# Patient Record
Sex: Male | Born: 1950 | Race: White | Hispanic: No | Marital: Married | Smoking: Never smoker
Health system: Southern US, Community
[De-identification: ages and names within clinical notes are randomized; demographics above are authoritative.]

## PROBLEM LIST (undated history)

## (undated) DIAGNOSIS — T7840XA Allergy, unspecified, initial encounter: Secondary | ICD-10-CM

## (undated) DIAGNOSIS — M4800 Spinal stenosis, site unspecified: Secondary | ICD-10-CM

## (undated) DIAGNOSIS — M1990 Unspecified osteoarthritis, unspecified site: Secondary | ICD-10-CM

## (undated) DIAGNOSIS — C801 Malignant (primary) neoplasm, unspecified: Secondary | ICD-10-CM

## (undated) DIAGNOSIS — I10 Essential (primary) hypertension: Secondary | ICD-10-CM

## (undated) DIAGNOSIS — J342 Deviated nasal septum: Secondary | ICD-10-CM

## (undated) DIAGNOSIS — E785 Hyperlipidemia, unspecified: Secondary | ICD-10-CM

## (undated) DIAGNOSIS — K219 Gastro-esophageal reflux disease without esophagitis: Secondary | ICD-10-CM

## (undated) HISTORY — PX: UPPER GASTROINTESTINAL ENDOSCOPY: SHX188

## (undated) HISTORY — DX: Unspecified osteoarthritis, unspecified site: M19.90

## (undated) HISTORY — PX: PROSTATECTOMY: SHX69

## (undated) HISTORY — DX: Spinal stenosis, site unspecified: M48.00

## (undated) HISTORY — DX: Gastro-esophageal reflux disease without esophagitis: K21.9

## (undated) HISTORY — DX: Malignant (primary) neoplasm, unspecified: C80.1

## (undated) HISTORY — PX: SPINE SURGERY: SHX786

## (undated) HISTORY — DX: Allergy, unspecified, initial encounter: T78.40XA

## (undated) HISTORY — PX: NASAL SEPTUM SURGERY: SHX37

---

## 2000-10-23 ENCOUNTER — Encounter: Admission: RE | Admit: 2000-10-23 | Discharge: 2001-01-21 | Payer: Self-pay | Admitting: *Deleted

## 2007-05-26 ENCOUNTER — Emergency Department (HOSPITAL_COMMUNITY): Admission: EM | Admit: 2007-05-26 | Discharge: 2007-05-27 | Payer: Self-pay | Admitting: Emergency Medicine

## 2008-06-29 ENCOUNTER — Ambulatory Visit: Payer: Self-pay | Admitting: Family Medicine

## 2008-06-29 DIAGNOSIS — M25569 Pain in unspecified knee: Secondary | ICD-10-CM | POA: Insufficient documentation

## 2008-06-29 DIAGNOSIS — E785 Hyperlipidemia, unspecified: Secondary | ICD-10-CM | POA: Insufficient documentation

## 2008-06-29 DIAGNOSIS — J309 Allergic rhinitis, unspecified: Secondary | ICD-10-CM | POA: Insufficient documentation

## 2008-06-29 DIAGNOSIS — I10 Essential (primary) hypertension: Secondary | ICD-10-CM | POA: Insufficient documentation

## 2008-06-29 DIAGNOSIS — M109 Gout, unspecified: Secondary | ICD-10-CM | POA: Insufficient documentation

## 2008-06-29 DIAGNOSIS — B019 Varicella without complication: Secondary | ICD-10-CM | POA: Insufficient documentation

## 2008-07-22 ENCOUNTER — Ambulatory Visit: Payer: Self-pay | Admitting: Family Medicine

## 2008-07-22 DIAGNOSIS — T7840XA Allergy, unspecified, initial encounter: Secondary | ICD-10-CM | POA: Insufficient documentation

## 2009-09-10 IMAGING — CR DG CHEST 2V
2 series · 2 of 2 positions shown · non-contrast
Comparison: None.

CLINICAL DATA: Feels like meat stuck in esophagus.  Difficulty swallowing. 
 CHEST - 2 VIEW:

[w chest pa]
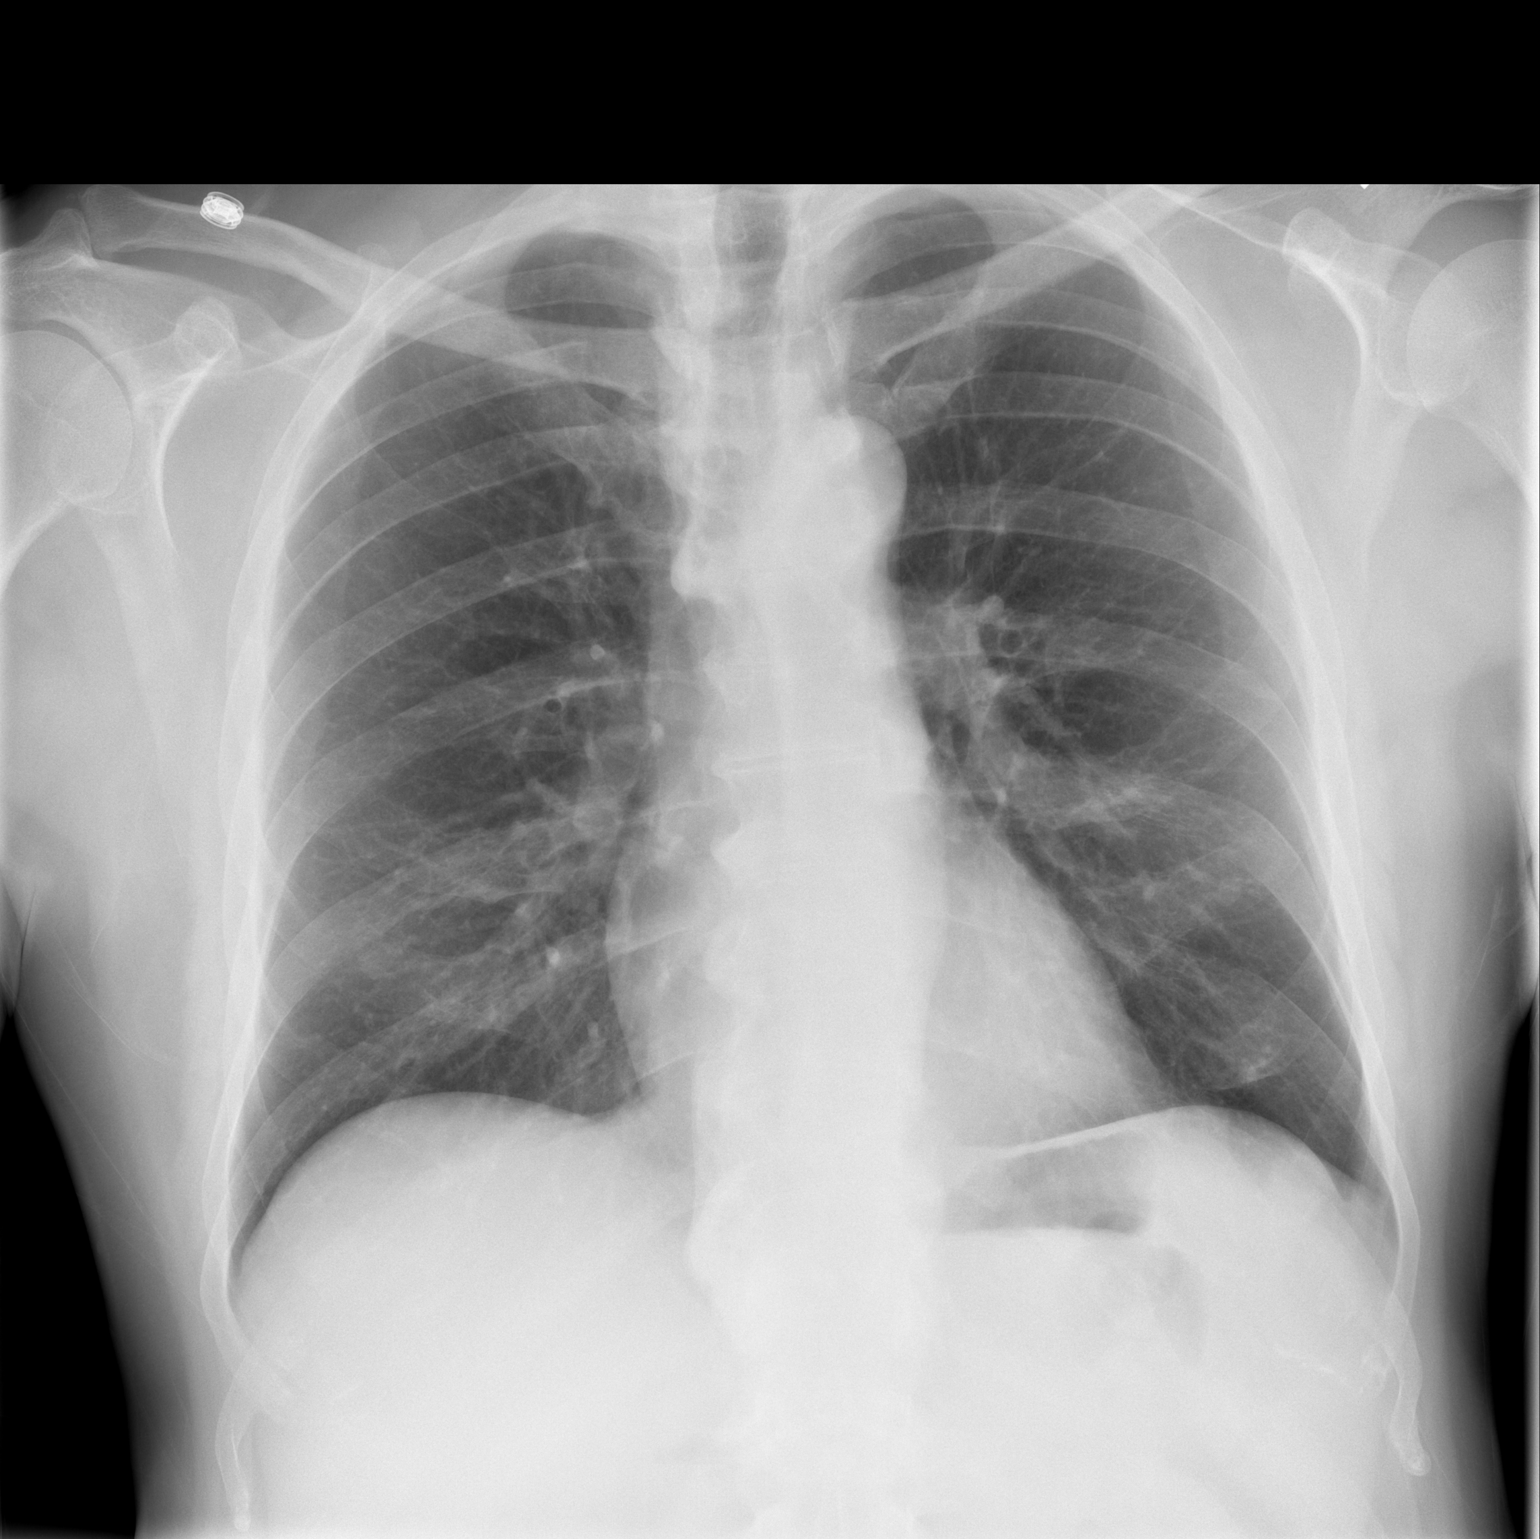

[w chest lat]
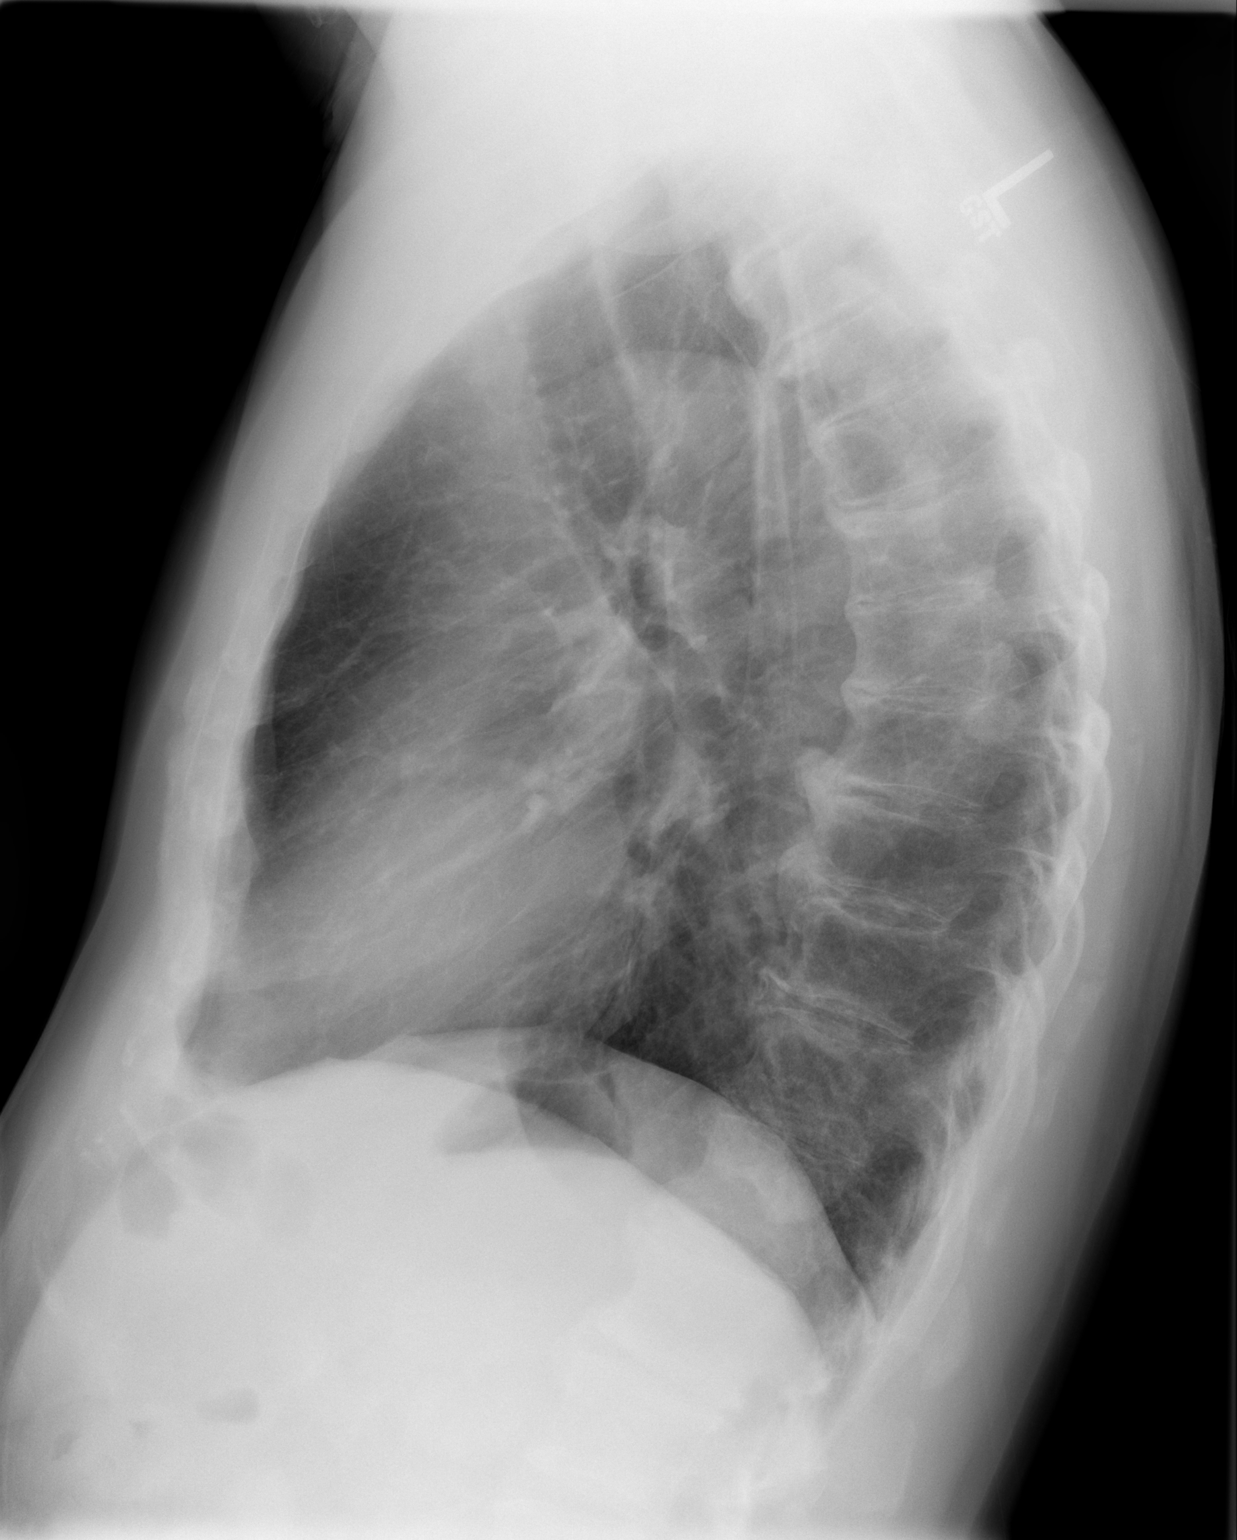

[2 of 2 positions shown; findings below may reference images not displayed]

FINDINGS: The heart size and mediastinal contours are unremarkable.  
 No pleural fluid or pulmonary edema is noted. 
 No airspace opacities are identified. 
 There is mild multilevel thoracic spondylosis.
IMPRESSION: No active disease.

## 2010-07-26 NOTE — Consult Note (Signed)
NAMEONOFRIO, Joseph NO.:  [id number]   MEDICAL RECORD NO.:  [id number]          PATIENT TYPE:  EMS   LOCATION:  MAJO                         FACILITY:  [facility]   PHYSICIAN:  Bernette Redbird, M.D.   DATE OF BIRTH:  Jan 30, 1951   DATE OF CONSULTATION:  05/27/2007  DATE OF DISCHARGE:                                 CONSULTATION   Dr. Benjiman Core of the [facility]. 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   Joseph Stanton has a history of intermittent dysphagia and in fact, his  primary physician had arranged for him to have GI evaluation and what  sounds like endoscopic dilatation through the [facility] where she  works in [place], [place].  He actually apparently had the  appointment with that clinic but deferred having his endoscopic  procedure because of the considerable expense that would be incurred  since he went on a new insurance plan recently.   With that background, he ate steak last night and it got caught in his  esophagus.  He presented to our emergency room and, per Dr. Arlington Calix  report, a chest CT showed what looked like a food bolus distally as well  as some additional material above it.  We were called and asked to  provide food disimpaction.  Since the patient was not in acute distress  and since it was approximately 3:30 in the morning, it was elected to  wait until 7 o'clock this morning to do the procedure.  In the meantime,  the patient had been given IV glucagon and a trial of sublingual  nitroglycerin x3 without immediate effect, but subsequently, the patient  fell sleep and subsequently the food bolus subjectively passed.   By the time we evaluated the patient at 7:00 in the morning, he was  having no secretions and still felt as though the bolus had passed.  We  gave him to cups of warm water which she drank without  any regurgitation  or difficulty, although he did have some burping during and following  the consumption of the fluid.   The patient has a handicapped son at home and wanted to get on home  rather than undergoing a procedure at this time.   He was informed that his CT scan looked abnormal and that, although his  symptoms are probably due to a benign condition such as an esophageal  ring, GI evaluation was needed to make sure that esophageal cancer was  not present.   He intends to be on a soft/liquid diet during the remainder of the week  and to follow up with his primary physician toward the end of the week  to have his esophagus taken care of.   Accordingly, we will not plan to arrange follow-up with [place].  This is an  unassigned patient through the emergency room.   No charge for this visit.           ______________________________  Bernette Redbird, M.D.  RB/MEDQ  D:  05/27/2007  T:  05/27/2007  Job:  [id number]   cc:   O. Danae Orleans, M.D.

## 2010-12-05 LAB — BASIC METABOLIC PANEL
CO2: 28
Calcium: 9.1
Chloride: 97
GFR calc Af Amer: >60
Potassium: 4.8
Sodium: 135

## 2010-12-05 LAB — DIFFERENTIAL
Lymphocytes Relative: 14
Lymphs Abs: 1.5
Neutro Abs: 8.2 — ABNORMAL HIGH
Neutrophils Relative %: 77

## 2010-12-05 LAB — CBC
Platelets: 245
WBC: 10.6 — ABNORMAL HIGH

## 2010-12-05 LAB — I-STAT 8, (EC8 V) (CONVERTED LAB)
BUN: 13
Glucose, Bld: 115 — ABNORMAL HIGH
Hemoglobin: 17
Potassium: 6.6
Sodium: 135
TCO2: 31

## 2010-12-05 LAB — POCT I-STAT CREATININE: Creatinine, Ser: 1.1

## 2011-04-01 ENCOUNTER — Encounter (HOSPITAL_COMMUNITY): Payer: Self-pay | Admitting: *Deleted

## 2011-04-01 ENCOUNTER — Emergency Department (HOSPITAL_COMMUNITY)
Admission: EM | Admit: 2011-04-01 | Discharge: 2011-04-02 | Disposition: A | Discharge status: Home / Self Care | Payer: PRIVATE HEALTH INSURANCE | Attending: Emergency Medicine | Admitting: Emergency Medicine

## 2011-04-01 DIAGNOSIS — R040 Epistaxis: Secondary | ICD-10-CM | POA: Insufficient documentation

## 2011-04-01 DIAGNOSIS — I10 Essential (primary) hypertension: Secondary | ICD-10-CM | POA: Insufficient documentation

## 2011-04-01 DIAGNOSIS — E785 Hyperlipidemia, unspecified: Secondary | ICD-10-CM | POA: Insufficient documentation

## 2011-04-01 HISTORY — DX: Essential (primary) hypertension: I10

## 2011-04-01 HISTORY — DX: Hyperlipidemia, unspecified: E78.5

## 2011-04-01 HISTORY — DX: Deviated nasal septum: J34.2

## 2011-04-01 NOTE — ED Notes (Signed)
ZOX:[id number]<AV> Expected date:<BR> Expected time:<BR> Means of arrival:<BR> Comments:<BR> Nose bleed

## 2011-04-01 NOTE — ED Notes (Signed)
Per EMS: Pt called EMS for a c/o of uncontrolled nose bleed. Bleeding is controlled on arrival. Pt is A&O x4, PERRLA. Hx of htn. Pt reports multiple bleeds today. Pt denies HA, N/V, blurred vision. Pt is in no apparent distress

## 2011-04-02 MED ORDER — OXYMETAZOLINE HCL 0.05 % NA SOLN
1.0000 | Freq: Once | NASAL | Status: AC
  Administered 2011-04-02: 1 via NASAL
  Filled 2011-04-02: qty 15

## 2011-04-02 MED ORDER — SILVER NITRATE-POT NITRATE 75-25 % EX MISC
1.0000 | Freq: Once | CUTANEOUS | Status: AC
  Administered 2011-04-02: 1 via TOPICAL
  Filled 2011-04-02: qty 1

## 2011-04-02 NOTE — ED Provider Notes (Signed)
Medical screening examination/treatment/procedure(s) were performed by non-physician practitioner and as supervising physician I was immediately available for consultation/collaboration.  Nicholes Stairs, MD 04/02/11 [phone]

## 2011-04-02 NOTE — ED Provider Notes (Signed)
History     CSN: [id number]  Arrival date & time 04/01/11  2308   First MD Initiated Contact with Patient 04/02/11 0018      Chief Complaint  Patient presents with  . Epistaxis    (Consider location/radiation/quality/duration/timing/severity/associated sxs/prior treatment) HPI Comments: Patient here with nose bleed - states lasted about 10 minutes - reports recent history of multiple bleeds per day x 1 week - saw PCP this past week about these - blood pressure noted to be elevated and was placed on additional medication - reports mainly from right nare but occasionally from left - no anticoagulant use noted.  Patient is a 61 y.o. male presenting with nosebleeds. The history is provided by the patient. No language interpreter was used.  Epistaxis  This is a recurrent problem. The current episode started more than 1 week ago. The problem occurs every several days. The problem has not changed since onset.The problem is associated with an unknown factor. The bleeding has been from the right nare. He has tried applying pressure for the symptoms. The treatment provided moderate relief. His past medical history is significant for frequent nosebleeds. His past medical history does not include bleeding disorder, sinus problems or allergies.    Past Medical History  Diagnosis Date  . Hypertension   . Gout   . Hyperlipidemia   . Deviated nasal septum     Past Surgical History  Procedure Date  . Nasal septum surgery     History reviewed. No pertinent family history.  History  Substance Use Topics  . Smoking status: Never Smoker   . Smokeless tobacco: Not on file  . Alcohol Use: No      Review of Systems  HENT: Positive for nosebleeds.   All other systems reviewed and are negative.    Allergies  Review of patient's allergies indicates no known allergies.  Home Medications   Current Outpatient Rx  Name Route Sig Dispense Refill  . ALLOPURINOL 100 MG PO TABS Oral Take 100  mg by mouth daily.    Marland Kitchen DILTIAZEM HCL ER COATED BEADS 360 MG PO CP24 Oral Take 360 mg by mouth daily.    Marland Kitchen DOXYCYCLINE HYCLATE 100 MG PO TABS Oral Take 100 mg by mouth daily. Patient is to take for 7 days started on 03-23-2011    . LOVASTATIN 40 MG PO TABS Oral Take 40 mg by mouth daily.      BP 179/111  Pulse 103  Temp(Src) 98.3 F (36.8 C) (Oral)  Resp 20  SpO2 97%  Physical Exam  Nursing note and vitals reviewed. Constitutional: He is oriented to person, place, and time. He appears well-developed and well-nourished. No distress.  HENT:  Head: Normocephalic and atraumatic.  Right Ear: External ear normal.  Left Ear: External ear normal.  Nose: Mucosal edema present. No septal deviation or nasal septal hematoma. Epistaxis is observed.  Mouth/Throat: Oropharynx is clear and moist. No oropharyngeal exudate.       Excoriation noted to right nare at septum, no septal defect noted.  Eyes: Conjunctivae are normal. Pupils are equal, round, and reactive to light. No scleral icterus.  Neck: Normal range of motion. Neck supple.  Cardiovascular: Normal rate, regular rhythm and normal heart sounds.  Exam reveals no gallop and no friction rub.   No murmur heard. Pulmonary/Chest: Effort normal and breath sounds normal. He exhibits no tenderness.  Abdominal: Soft. Bowel sounds are normal. He exhibits distension.  Musculoskeletal: Normal range of motion.  Lymphadenopathy:  He has no cervical adenopathy.  Neurological: He is alert and oriented to person, place, and time. No cranial nerve deficit.  Skin: Skin is warm and dry.  Psychiatric: He has a normal mood and affect. His behavior is normal. Judgment and thought content normal.    ED Course  EPISTAXIS MANAGEMENT Date/Time: 04/02/2011 1:42 AM Performed by: Marisue Humble, Navika Hoopes C. Authorized by: Patrecia Pour Consent: Verbal consent obtained. Written consent not obtained. Risks and benefits: risks, benefits and alternatives were  discussed Consent given by: patient Patient understanding: patient states understanding of the procedure being performed Patient consent: the patient's understanding of the procedure does not match consent given Procedure consent: procedure consent does not match procedure scheduled Relevant documents: relevant documents not present or verified Test results: test results not available Site marked: the operative site was not marked Imaging studies: imaging studies not available Patient identity confirmed: verbally with patient and arm band Time out: Immediately prior to procedure a "time out" was called to verify the correct patient, procedure, equipment, support staff and site/side marked as required. Local anesthetic: lidocaine spray Anesthetic total: 3 ml Patient sedated: no Treatment site: right anterior Repair method: silver nitrate Post-procedure assessment: bleeding stopped Treatment complexity: simple Patient tolerance: Patient tolerated the procedure well with no immediate complications.   (including critical care time)  Labs Reviewed - No data to display No results found.  Epistaxis right nare    MDM  Patient with acute epistaxis of right nare - history of same, hemodynamically stable - bleeding controlled after silver nitrate.        Izola Price [place], [place] 04/02/11 [phone]

## 2017-10-15 ENCOUNTER — Other Ambulatory Visit: Payer: Self-pay

## 2017-10-15 ENCOUNTER — Encounter (HOSPITAL_COMMUNITY): Payer: Self-pay

## 2017-10-15 ENCOUNTER — Emergency Department (HOSPITAL_COMMUNITY): Payer: Medicare Other

## 2017-10-15 ENCOUNTER — Ambulatory Visit (HOSPITAL_COMMUNITY)
Admission: EM | Admit: 2017-10-15 | Discharge: 2017-10-16 | Disposition: A | Discharge status: Home / Self Care | Payer: Medicare Other | Attending: Emergency Medicine | Admitting: Emergency Medicine

## 2017-10-15 DIAGNOSIS — Z79899 Other long term (current) drug therapy: Secondary | ICD-10-CM | POA: Diagnosis not present

## 2017-10-15 DIAGNOSIS — T18128A Food in esophagus causing other injury, initial encounter: Secondary | ICD-10-CM

## 2017-10-15 DIAGNOSIS — X58XXXA Exposure to other specified factors, initial encounter: Secondary | ICD-10-CM | POA: Diagnosis not present

## 2017-10-15 DIAGNOSIS — K222 Esophageal obstruction: Secondary | ICD-10-CM | POA: Diagnosis not present

## 2017-10-15 DIAGNOSIS — M109 Gout, unspecified: Secondary | ICD-10-CM | POA: Diagnosis not present

## 2017-10-15 DIAGNOSIS — E785 Hyperlipidemia, unspecified: Secondary | ICD-10-CM | POA: Insufficient documentation

## 2017-10-15 DIAGNOSIS — K228 Other specified diseases of esophagus: Secondary | ICD-10-CM | POA: Diagnosis not present

## 2017-10-15 DIAGNOSIS — I10 Essential (primary) hypertension: Secondary | ICD-10-CM | POA: Insufficient documentation

## 2017-10-15 MED ORDER — GI COCKTAIL ~~LOC~~
30.0000 mL | Freq: Once | ORAL | Status: AC
  Administered 2017-10-16: 30 mL via ORAL
  Filled 2017-10-15: qty 30

## 2017-10-15 MED ORDER — ONDANSETRON 4 MG PO TBDP
8.0000 mg | ORAL_TABLET | Freq: Once | ORAL | Status: AC
  Administered 2017-10-15: 8 mg via ORAL
  Filled 2017-10-15: qty 2

## 2017-10-15 NOTE — ED Provider Notes (Signed)
[place] EMERGENCY DEPARTMENT Provider Note   CSN: [id number] Arrival date & time: 10/15/17  1735     History   Chief Complaint Chief Complaint  Patient presents with  . Dysphagia    HPI Joseph Stanton Range is a 67 y.o. male.  The history is provided by the patient.  Illness  This is a new problem. The current episode started yesterday. The problem occurs constantly. The problem has not changed since onset.Pertinent negatives include no chest pain, no abdominal pain, no headaches and no shortness of breath. Nothing aggravates the symptoms. Nothing relieves the symptoms. He has tried nothing for the symptoms. The treatment provided no relief.  Patient with PMH including cervical fusion presents with reports of occasional solids getting stuck for some time.  Most recently, chicken got stuck yesterday.  Then was able to tolerate PO, an english muffin without regurgitation and then juice starting coming back up this evening.  No SOB.  No weakness nor numbness.  No changes in vision or speech.    Past Medical History:  Diagnosis Date  . Deviated nasal septum   . Gout   . Hyperlipidemia   . Hypertension     Patient Active Problem List   Diagnosis Date Noted  . ALLERGY, ENVIRONMENTAL 07/22/2008  . CHICKENPOX 06/29/2008  . HYPERLIPIDEMIA 06/29/2008  . GOUT 06/29/2008  . HYPERTENSION 06/29/2008  . ALLERGIC RHINITIS 06/29/2008  . KNEE PAIN 06/29/2008    Past Surgical History:  Procedure Laterality Date  . NASAL SEPTUM SURGERY          Home Medications    Prior to Admission medications   Medication Sig Start Date End Date Taking? Authorizing Provider  allopurinol (ZYLOPRIM) 100 MG tablet Take 100 mg by mouth daily.    [provider]  diltiazem (CARDIZEM CD) 360 MG 24 hr capsule Take 360 mg by mouth daily.    [provider]  doxycycline (VIBRA-TABS) 100 MG tablet Take 100 mg by mouth daily. Patient is to take for 7 days started on  03-23-2011    [provider]  lovastatin (MEVACOR) 40 MG tablet Take 40 mg by mouth daily.    [provider]    Family History History reviewed. No pertinent family history.  Social History Social History   Tobacco Use  . Smoking status: Never Smoker  Substance Use Topics  . Alcohol use: No  . Drug use: No     Allergies   Patient has no known allergies.   Review of Systems Review of Systems  Constitutional: Negative for appetite change, diaphoresis and unexpected weight change.  HENT:       Food getting stuck  Eyes: Negative for visual disturbance.  Respiratory: Negative for shortness of breath.   Cardiovascular: Negative for chest pain, palpitations and leg swelling.  Gastrointestinal: Negative for abdominal pain.  Neurological: Negative for dizziness, seizures, syncope, facial asymmetry, speech difficulty, weakness, numbness and headaches.  All other systems reviewed and are negative.    Physical Exam Updated Vital Signs BP (!) 184/88 (BP Location: Right Arm)   Pulse 87   Temp 98.2 F (36.8 C) (Oral)   Resp 14   Ht 5\' 9"  (1.753 m)   Wt 90.3 kg (199 lb)   SpO2 99%   BMI 29.39 kg/m   Physical Exam  Constitutional: He is oriented to person, place, and time. He appears well-developed and well-nourished. No distress.  HENT:  Head: Normocephalic and atraumatic.  Nose: Nose normal.  Mouth/Throat:  No oropharyngeal exudate.  Eyes: Pupils are equal, round, and reactive to light. Conjunctivae are normal.  Neck: Normal range of motion. Neck supple.  Cardiovascular: Normal rate, regular rhythm, normal heart sounds and intact distal pulses.  Pulmonary/Chest: Effort normal and breath sounds normal. No stridor. No respiratory distress. He has no wheezes. He has no rales. He exhibits no tenderness.  Abdominal: Soft. Bowel sounds are normal. He exhibits no mass. There is no tenderness. There is no rebound and no guarding.  Musculoskeletal: Normal range  of motion.  Neurological: He is alert and oriented to person, place, and time. He displays normal reflexes.  Skin: Skin is warm and dry. Capillary refill takes less than 2 seconds.  Psychiatric: He has a normal mood and affect.  Nursing note and vitals reviewed.    ED Treatments / Results  Labs (all labs ordered are listed, but only abnormal results are displayed) Labs Reviewed - No data to display  EKG None  Radiology No results found.  Procedures Procedures (including critical care time)  Medications Ordered in ED Medications  ondansetron (ZOFRAN-ODT) disintegrating tablet 8 mg (has no administration in time range)  gi cocktail (Maalox,Lidocaine,Donnatal) (has no administration in time range)    Seen by Gi for esophageal impaction.     Final Clinical Impressions(s) / ED Diagnoses   Follow up with GI as an outpatient for dysphagia as well as your PMD. Eat a soft diet until seen by GI.  RX for Prilosec.     Return for pain, numbness, changes in vision or speech, fevers >100.4 unrelieved by medication, shortness of breath, intractable vomiting, or diarrhea, abdominal pain, Inability to tolerate liquids or food, cough, altered mental status or any concerns. No signs of systemic illness or infection. The patient is nontoxic-appearing on exam and vital signs are within normal limits. Will refer to urology for microscopy hematuria as patient is asymptomatic.  I have reviewed the triage vital signs and the nursing notes. Pertinent labs &imaging results that were available during my care of the patient were reviewed by me and considered in my medical decision making (see chart for details).  After history, exam, and medical workup I feel the patient has been appropriately medically screened and is safe for discharge home. Pertinent diagnoses were discussed with the patient. Patient was given return precautions.     Zoha Spranger, MD 10/16/17 [phone]

## 2017-10-15 NOTE — ED Notes (Signed)
Pt alert and oriented. Airway patent. Speech clear. Secretions controlled. Pt reports no difficulty breathing.

## 2017-10-15 NOTE — ED Triage Notes (Signed)
Pt endorses difficulty swallowing "for a while" Pt states that he drank some orange juice earlier today while trying to take his meds and the orange juice came back. Hypertensive in triage and did not take meds due to difficulty swallowing. Airway intact.

## 2017-10-15 NOTE — ED Notes (Signed)
Patient transported to X-ray 

## 2017-10-16 ENCOUNTER — Encounter (HOSPITAL_COMMUNITY): Payer: Self-pay | Admitting: *Deleted

## 2017-10-16 ENCOUNTER — Encounter (HOSPITAL_COMMUNITY)
Admission: EM | Disposition: A | Discharge status: Home / Self Care | Payer: Self-pay | Source: Home / Self Care | Attending: Emergency Medicine

## 2017-10-16 DIAGNOSIS — K228 Other specified diseases of esophagus: Secondary | ICD-10-CM

## 2017-10-16 DIAGNOSIS — T18128A Food in esophagus causing other injury, initial encounter: Secondary | ICD-10-CM

## 2017-10-16 DIAGNOSIS — K222 Esophageal obstruction: Secondary | ICD-10-CM

## 2017-10-16 HISTORY — PX: ESOPHAGOGASTRODUODENOSCOPY (EGD) WITH PROPOFOL: SHX5813

## 2017-10-16 HISTORY — PX: FOREIGN BODY REMOVAL: SHX962

## 2017-10-16 LAB — BASIC METABOLIC PANEL
Anion gap: 11 (ref 5–15)
BUN: 13 mg/dL (ref 8–23)
CHLORIDE: 103 mmol/L (ref 98–111)
CO2: 29 mmol/L (ref 22–32)
CREATININE: 0.83 mg/dL (ref 0.61–1.24)
Calcium: 9.4 mg/dL (ref 8.9–10.3)
GFR calc Af Amer: >60 mL/min (ref >60)
GFR calc non Af Amer: >60 mL/min (ref >60)
GLUCOSE: 131 mg/dL — AB (ref 70–99)
POTASSIUM: 3.6 mmol/L (ref 3.5–5.1)
SODIUM: 143 mmol/L (ref 135–145)

## 2017-10-16 LAB — CBC WITH DIFFERENTIAL/PLATELET
ABS IMMATURE GRANULOCYTES: 0 10*3/uL (ref 0.0–0.1)
BASOS ABS: 0.1 10*3/uL (ref 0.0–0.1)
Basophils Relative: 0 %
EOS PCT: 3 %
Eosinophils Absolute: 0.3 10*3/uL (ref 0.0–0.7)
HCT: 46.1 % (ref 39.0–52.0)
HEMOGLOBIN: 15.2 g/dL (ref 13.0–17.0)
Immature Granulocytes: 0 %
LYMPHS PCT: 13 %
Lymphs Abs: 1.6 10*3/uL (ref 0.7–4.0)
MCH: 29.2 pg (ref 26.0–34.0)
MCHC: 33 g/dL (ref 30.0–36.0)
MCV: 88.5 fL (ref 78.0–100.0)
MONOS PCT: 6 %
Monocytes Absolute: 0.8 10*3/uL (ref 0.1–1.0)
Neutro Abs: 9.1 10*3/uL — ABNORMAL HIGH (ref 1.7–7.7)
Neutrophils Relative %: 78 %
Platelets: 177 10*3/uL (ref 150–400)
RBC: 5.21 MIL/uL (ref 4.22–5.81)
RDW: 13.8 % (ref 11.5–15.5)
WBC: 11.8 10*3/uL — ABNORMAL HIGH (ref 4.0–10.5)

## 2017-10-16 SURGERY — ESOPHAGOGASTRODUODENOSCOPY (EGD) WITH PROPOFOL
Anesthesia: Monitor Anesthesia Care

## 2017-10-16 MED ORDER — FENTANYL CITRATE (PF) 100 MCG/2ML IJ SOLN
INTRAMUSCULAR | Status: AC
  Filled 2017-10-16: qty 2

## 2017-10-16 MED ORDER — NITROGLYCERIN 0.4 MG SL SUBL
0.4000 mg | SUBLINGUAL_TABLET | SUBLINGUAL | Status: DC | PRN
  Filled 2017-10-16: qty 1

## 2017-10-16 MED ORDER — MIDAZOLAM HCL 10 MG/2ML IJ SOLN
INTRAMUSCULAR | Status: DC | PRN
  Administered 2017-10-16: 2 mg via INTRAVENOUS
  Administered 2017-10-16: 1 mg via INTRAVENOUS
  Administered 2017-10-16: 2 mg via INTRAVENOUS

## 2017-10-16 MED ORDER — BUTAMBEN-TETRACAINE-BENZOCAINE 2-2-14 % EX AERO
INHALATION_SPRAY | CUTANEOUS | Status: DC | PRN
  Administered 2017-10-16: 1 via TOPICAL

## 2017-10-16 MED ORDER — OMEPRAZOLE 20 MG PO CPDR: 20.0000 mg | DELAYED_RELEASE_CAPSULE | Freq: Every day | ORAL | 0 refills | Status: DC

## 2017-10-16 MED ORDER — SODIUM CHLORIDE 0.9 % IV BOLUS
1000.0000 mL | Freq: Once | INTRAVENOUS | Status: AC
  Administered 2017-10-16: 1000 mL via INTRAVENOUS

## 2017-10-16 MED ORDER — MIDAZOLAM HCL 5 MG/ML IJ SOLN
INTRAMUSCULAR | Status: AC
  Filled 2017-10-16: qty 3

## 2017-10-16 MED ORDER — DIPHENHYDRAMINE HCL 50 MG/ML IJ SOLN
INTRAMUSCULAR | Status: AC
  Filled 2017-10-16: qty 1

## 2017-10-16 MED ORDER — FENTANYL CITRATE (PF) 100 MCG/2ML IJ SOLN
INTRAMUSCULAR | Status: DC | PRN
  Administered 2017-10-16: 25 ug via INTRAVENOUS

## 2017-10-16 SURGICAL SUPPLY — 14 items

## 2017-10-16 NOTE — ED Notes (Signed)
Writer provided pt with saltine crackers and ginger ale, pt was not able to keep it down and vomited into the emesis bag.

## 2017-10-16 NOTE — ED Notes (Addendum)
After drinking GI cocktail, pt attempted to drink water and started spitting. Pt noted to be anxious

## 2017-10-16 NOTE — Op Note (Addendum)
[facility] Patient Name: Staton Markey Range Procedure Date : 10/16/2017 MRN: [id number] Attending MD: Estill Cotta. Loletha Carrow , MD Date of Birth: [birth year] CSN: [id number] Age: 67 Admit Type: Outpatient Procedure:                Upper GI endoscopy Indications:              Removal of foreign body in the esophagus (food                            impaction) Providers:                Estill Cotta. Loletha Carrow, MD, Vista Lawman, RN, Elspeth Cho, Technician Referring MD:             [facility] ED physician Randal Buba) Medicines:                Midazolam 5 mg IV, Fentanyl 25 micrograms IV,                            Cetacaine spray Complications:            No immediate complications. Estimated Blood Loss:     Estimated blood loss: none. Procedure:                Pre-Anesthesia Assessment:                           - Prior to the procedure, a History and Physical                            was performed, and patient medications and                            allergies were reviewed. The patient's tolerance of                            previous anesthesia was also reviewed. The risks                            and benefits of the procedure and the sedation                            options and risks were discussed with the patient.                            All questions were answered, and informed consent                            was obtained. Prior Anticoagulants: The patient has                            taken no previous anticoagulant or antiplatelet  agents. ASA Grade Assessment: II - A patient with                            mild systemic disease. After reviewing the risks                            and benefits, the patient was deemed in                            satisfactory condition to undergo the procedure.                           After obtaining informed consent, the endoscope was                            passed under  direct vision. Throughout the                            procedure, the patient's blood pressure, pulse, and                            oxygen saturations were monitored continuously. The                            GIF-H190 ([id number]) Olympus Adult EGD was introduced                            through the mouth, and advanced to the second part                            of duodenum. The upper GI endoscopy was                            accomplished without difficulty. The patient                            tolerated the procedure well. Scope In: Scope Out: Findings:      Food was found at the gastroesophageal junction. Removal of food was       accomplished with a 4-prong grasper and removal of the food along with       the scope. The scope was then re-insterted.      One benign-appearing, intrinsic moderate stenosis was found at the       gastroesophageal junction. This stenosis measured 1.2 cm (inner       diameter). The stenosis was traversed.      Multiple tongues of salmon-colored mucosa were present. The maximum       longitudinal extent of these esophageal mucosal changes was 2-3 cm in       length. Not biopsied. The distal esophageal mucosa was edematous and       somewhat macerated from the impaction.      The stomach was normal.      The cardia and gastric fundus were normal on retroflexion.      The examined duodenum was normal. Impression:               -  Food at the gastroesophageal junction. Removal                            was successful.                           - Benign-appearing esophageal stenosis.                           - Salmon-colored mucosa.                           - Normal stomach.                           - Normal examined duodenum. Moderate Sedation:      Moderate (conscious) sedation was administered by the endoscopy nurse       and supervised by the endoscopist. The following parameters were       monitored: oxygen saturation, heart rate, blood  pressure, and response       to care. Total physician intraservice time was 12 minutes. Recommendation:           - Discharge patient to home.                           - Resume regular diet.                           - Return to my office at appointment to be                            scheduled. Call for an appointment. Repeat EGD for                            biopsies and stricture dilation will then be                            scheduled.                           - Use Prilosec (omeprazole) 20 mg PO daily. Procedure Code(s):        --- Professional ---                           [phone], Esophagogastroduodenoscopy, flexible,                            transoral; with removal of foreign body(s) Diagnosis Code(s):        --- Professional ---                           E33.295J, Food in esophagus causing other injury,                            initial encounter  K22.2, Esophageal obstruction                           K22.8, Other specified diseases of esophagus                           T18.108A, Unspecified foreign body in esophagus                            causing other injury, initial encounter CPT copyright 2017 [employer] Medical [employer]. All rights reserved. The codes documented in this report are preliminary and upon coder review may  be revised to meet current compliance requirements. Henry L. Loletha Carrow, MD 10/16/2017 5:43:01 AM This report has been signed electronically. Number of Addenda: 0

## 2017-10-16 NOTE — ED Notes (Addendum)
Pt alert and oriented x4. Skin warm and dry. Respirations equal and unlabored. Pt states he has money  to take a cab.Pt ambulatory with steady gait.

## 2017-10-16 NOTE — Op Note (Signed)
Procedure:   EGD with removal foreign body from esophagus  Meds:   Cetacaine spray, Versed 5 mg, Fentanyl 25 micrograms IV  Indication:  Esophageal food impaction  Findings:   Esophagus: Meat impacted in distal esophageal stricture.  Relieved with 4-prong grasper and pulled out through mouth with scope. Scope re-inserted.  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   Stomach:  normal   Duodenum:  normal   Impression:  Esophageal food bolus impaction - relieved.  Recommendations: Will need repeat outpatient EGD, timing TBD. Follow up with me in office.  Call for appointment. OTC omeprazole 20 mg once daily.   Dorna Leitz GI Pager [phone]

## 2017-10-16 NOTE — ED Notes (Signed)
GI for EGD at bedside

## 2017-10-16 NOTE — ED Notes (Signed)
Chem 8 results given to Dr. Randal Buba. Stated that she wants a BMP drawn to confirm Chem 8 results

## 2017-11-06 ENCOUNTER — Ambulatory Visit: Payer: Medicare Other | Admitting: Gastroenterology

## 2017-11-06 ENCOUNTER — Encounter: Payer: Self-pay | Admitting: Gastroenterology

## 2017-11-06 VITALS — BP 144/90 | HR 64 | Ht 69.0 in | Wt 220.0 lb

## 2017-11-06 DIAGNOSIS — K222 Esophageal obstruction: Secondary | ICD-10-CM

## 2017-11-06 DIAGNOSIS — T18128S Food in esophagus causing other injury, sequela: Secondary | ICD-10-CM

## 2017-11-06 NOTE — Progress Notes (Signed)
     [facility] Progress Note  Chief Complaint: Esophageal stricture in the recent food impaction  Subjective  History:  Joseph Stanton follows up after his recent emergency department visit for an esophageal food impaction.  He has been well since then, no recurrent dysphagia since he has been much more careful eating.  He reports at least several months of intermittent solid food dysphagia leading up to that impaction.  He does not get heartburn regularly, and is been on omeprazole 20 mg daily since the recent ED visit.  He denies nausea, vomiting, early satiety or weight loss.  ROS: Cardiovascular:  no chest pain Respiratory: no dyspnea Positive for arthralgias The patient's Past Medical, Family and Social History were reviewed and are on file in the EMR. Past Medical History:  Diagnosis Date  . Deviated nasal septum   . Gout   . Hyperlipidemia   . Hypertension    Past Surgical History:  Procedure Laterality Date  . ESOPHAGOGASTRODUODENOSCOPY (EGD) WITH PROPOFOL N/A 10/16/2017   Procedure: ESOPHAGOGASTRODUODENOSCOPY (EGD) WITH PROPOFOL;  Surgeon: Doran Stabler, MD;  Location: [facility];  Service: Gastroenterology;  Laterality: N/A;  . FOREIGN BODY REMOVAL  10/16/2017   Procedure: FOREIGN BODY REMOVAL;  Surgeon: Doran Stabler, MD;  Location: [facility];  Service: Gastroenterology;;  . NASAL SEPTUM SURGERY      Objective:  Med list reviewed  Current Outpatient Medications:  .  allopurinol (ZYLOPRIM) 100 MG tablet, Take 100 mg by mouth daily., Disp: , Rfl:  .  carvedilol (COREG) 25 MG tablet, Take 25 mg by mouth 2 (two) times daily., Disp: , Rfl: 3 .  losartan (COZAAR) 100 MG tablet, Take 100 mg by mouth daily., Disp: , Rfl: 3 .  lovastatin (MEVACOR) 40 MG tablet, Take 40 mg by mouth daily., Disp: , Rfl:  .  omeprazole (PRILOSEC) 20 MG capsule, Take 1 capsule (20 mg total) by mouth daily., Disp: 30 capsule, Rfl: 0   Vital signs in last 24 hrs: Vitals:   11/06/17  1557  BP: (!) 144/90  Pulse: 64    Physical Exam  Well-appearing, antalgic gait  HEENT: sclera anicteric, oral mucosa moist without lesions  Neck: supple, no thyromegaly, JVD or lymphadenopathy  Cardiac: RRR without murmurs, S1S2 heard, no peripheral edema  Pulm: clear to auscultation bilaterally, normal RR and effort noted  Abdomen: soft, no tenderness, with active bowel sounds. No guarding or palpable hepatosplenomegaly    @ASSESSMENTPLANBEGIN @ Assessment: Encounter Diagnoses  Name Primary?  . Esophageal stricture Yes  . Food impaction of esophagus, sequela    His symptoms are much improved lately, seemingly due to greater caution when eating.  He had a significant stricture with a diameter of about 12 mm.  I advised him to have an upper endoscopy for stricture dilation and repeat assessment of the distal esophageal mucosa to rule out Barrett's esophagus.  The mucosa was not well visualized during the food impaction due to the mucosal edema and acute inflammation of the impaction.  He is agreeable to an upper endoscopy, but expressed some concern about scheduling as he is not certain he can get a ride.  We gave him some information about a ride service for higher, and he will also inquire with some friends or family.  He was given our contact information to let us know soon as possible.  Total time 20 minutes, over half spent face-to-face with patient in counseling and coordination of care.   Nelida Meuse III

## 2017-11-06 NOTE — Patient Instructions (Signed)
If you are age 66 or older, your body mass index should be between 23-30. Your Body mass index is 32.49 kg/m. If this is out of the aforementioned range listed, please consider follow up with your Primary Care Provider.  If you are age 74 or younger, your body mass index should be between 19-25. Your Body mass index is 32.49 kg/m. If this is out of the aformentioned range listed, please consider follow up with your Primary Care Provider.   It has been recommended to you by your physician that you have a(n) EGD completed. Per your request, we did not schedule the procedure(s) today. Please contact our office at [phone] should you decide to have the procedure completed.   It was a pleasure to see you today!  Dr. Loletha Carrow

## 2017-11-08 ENCOUNTER — Telehealth: Payer: Self-pay | Admitting: Gastroenterology

## 2017-11-08 MED ORDER — OMEPRAZOLE 20 MG PO CPDR: 20.0000 mg | DELAYED_RELEASE_CAPSULE | Freq: Every day | ORAL | 2 refills | Status: DC

## 2017-11-08 NOTE — Telephone Encounter (Signed)
done

## 2017-11-08 NOTE — Telephone Encounter (Signed)
Refill request for omeprazole 20mg  1 a day. Last seen 11-07-2017

## 2017-11-14 ENCOUNTER — Encounter: Payer: Self-pay | Admitting: Gastroenterology

## 2017-11-30 ENCOUNTER — Encounter: Payer: Medicare Other | Admitting: Gastroenterology

## 2017-12-03 ENCOUNTER — Encounter: Payer: Medicare Other | Admitting: Internal Medicine

## 2017-12-10 ENCOUNTER — Encounter: Payer: Self-pay | Admitting: Gastroenterology

## 2017-12-10 ENCOUNTER — Other Ambulatory Visit: Payer: Self-pay

## 2017-12-10 ENCOUNTER — Ambulatory Visit (AMBULATORY_SURGERY_CENTER): Payer: Self-pay | Admitting: *Deleted

## 2017-12-10 VITALS — Ht 69.0 in | Wt 221.0 lb

## 2017-12-10 DIAGNOSIS — K222 Esophageal obstruction: Secondary | ICD-10-CM

## 2017-12-10 NOTE — Progress Notes (Signed)
No egg or soy allergy known to patient  No issues with past sedation with any surgeries  or procedures, no intubation problems  No diet pills per patient No home 02 use per patient  No blood thinners per patient  Pt denies issues with constipation  No A fib or A flutter  EMMI video offered and declined by the patient. 

## 2017-12-20 ENCOUNTER — Encounter: Payer: Medicare Other | Admitting: Gastroenterology

## 2022-07-13 ENCOUNTER — Ambulatory Visit: Payer: Medicare Other | Admitting: Nurse Practitioner

## 2022-07-21 ENCOUNTER — Encounter: Payer: Self-pay | Admitting: Nurse Practitioner

## 2022-07-21 ENCOUNTER — Ambulatory Visit: Payer: Medicare HMO | Admitting: Nurse Practitioner

## 2022-07-21 VITALS — BP 190/98 | HR 90 | Temp 99.8°F | Resp 16 | Ht 69.0 in | Wt 219.4 lb

## 2022-07-21 DIAGNOSIS — Z8739 Personal history of other diseases of the musculoskeletal system and connective tissue: Secondary | ICD-10-CM | POA: Diagnosis not present

## 2022-07-21 DIAGNOSIS — E669 Obesity, unspecified: Secondary | ICD-10-CM | POA: Diagnosis not present

## 2022-07-21 DIAGNOSIS — Z125 Encounter for screening for malignant neoplasm of prostate: Secondary | ICD-10-CM

## 2022-07-21 DIAGNOSIS — K219 Gastro-esophageal reflux disease without esophagitis: Secondary | ICD-10-CM | POA: Insufficient documentation

## 2022-07-21 DIAGNOSIS — Z6830 Body mass index (BMI) 30.0-30.9, adult: Secondary | ICD-10-CM | POA: Diagnosis not present

## 2022-07-21 DIAGNOSIS — I10 Essential (primary) hypertension: Secondary | ICD-10-CM | POA: Diagnosis not present

## 2022-07-21 DIAGNOSIS — E785 Hyperlipidemia, unspecified: Secondary | ICD-10-CM

## 2022-07-21 LAB — CBC
RBC: 4.85 10*6/uL (ref 4.20–5.80)
WBC: 7.6 10*3/uL (ref 3.8–10.8)

## 2022-07-21 MED ORDER — ALLOPURINOL 100 MG PO TABS
100.0000 mg | ORAL_TABLET | Freq: Two times a day (BID) | ORAL | 0 refills | Status: DC
Start: 2022-07-21 — End: 2022-08-13

## 2022-07-21 MED ORDER — VALSARTAN 160 MG PO TABS
160.0000 mg | ORAL_TABLET | Freq: Every day | ORAL | 0 refills | Status: DC
Start: 2022-07-21 — End: 2022-08-13

## 2022-07-21 NOTE — Assessment & Plan Note (Signed)
Patient on lovastatin 40mg . Continue

## 2022-07-21 NOTE — Progress Notes (Signed)
New Patient Office Visit  Subjective    Patient ID: Joseph Stanton, male    DOB: Sep 19, 1950  Age: 72 y.o. MRN: [id number]  CC:  Chief Complaint  Patient presents with   Establish Care    HPI Joseph Stanton presents to establish care  HTN: States that he does check his blood pressure at home. He does take all blood pressure medications as prescribed and tolerates them well per his report. States that he always had high numbers at other office even with medication   HLD: patient is currently maintained on lovastatin 40mg  and tolerates medication well per his report  GERD: states that he takes it every other day and does fine. States at least 6 years of being on the medication   Gout: States that he does not have a flare since being on the gout medication. Take allopurinol 100mg  BID   Colonoscopy: states that it has been with [facility] 2-3 years ago.  PSA: prostatectmy hx of prostate cancer in his later 19s   Takes the tylenol twice a day becasuse of his right thumb he injuried 6 weeks ago that has not gotten better.   Outpatient Encounter Medications as of 07/21/2022  Medication Sig   carvedilol (COREG) 25 MG tablet Take 25 mg by mouth 2 (two) times daily.   cloNIDine (CATAPRES) 0.2 MG tablet Take 0.2 mg by mouth 2 (two) times daily.   ketoconazole (NIZORAL) 2 % shampoo APPLY TO AFFECTED AREA TWICE A DAY   lovastatin (MEVACOR) 40 MG tablet Take 40 mg by mouth daily.   Multiple Vitamin (ESSENTIAL ONE DAILY PO) Take 1 capsule by mouth daily.   Multiple Vitamins-Minerals (HAIR/SKIN/NAILS/BIOTIN PO) Take 1 capsule by mouth daily.   omeprazole (PRILOSEC) 20 MG capsule Take 1 capsule (20 mg total) by mouth daily.   valsartan (DIOVAN) 160 MG tablet Take 1 tablet (160 mg total) by mouth daily.   [DISCONTINUED] allopurinol (ZYLOPRIM) 100 MG tablet Take 100 mg by mouth daily.   [DISCONTINUED] losartan (COZAAR) 100 MG tablet Take 100 mg by mouth daily.   allopurinol (ZYLOPRIM) 100 MG tablet  Take 1 tablet (100 mg total) by mouth 2 (two) times daily.   No facility-administered encounter medications on file as of 07/21/2022.    Past Medical History:  Diagnosis Date   Allergy    Arthritis    Cancer (HCC)    prostate   Deviated nasal septum    GERD (gastroesophageal reflux disease)    Gout    Hyperlipidemia    Hypertension    Spinal stenosis     Past Surgical History:  Procedure Laterality Date   ESOPHAGOGASTRODUODENOSCOPY (EGD) WITH PROPOFOL N/A 10/16/2017   Procedure: ESOPHAGOGASTRODUODENOSCOPY (EGD) WITH PROPOFOL;  Surgeon: Sherrilyn Rist, MD;  Location: [facility]COPY;  Service: Gastroenterology;  Laterality: N/A;   FOREIGN BODY REMOVAL  10/16/2017   Procedure: FOREIGN BODY REMOVAL;  Surgeon: Sherrilyn Rist, MD;  Location: [facility];  Service: Gastroenterology;;   NASAL SEPTUM SURGERY     PROSTATECTOMY     SPINE SURGERY     UPPER GASTROINTESTINAL ENDOSCOPY      Family History  Problem Relation Age of Onset   Hypertension Mother    Hypertension Father    Colon cancer Neg Hx    Colon polyps Neg Hx    Esophageal cancer Neg Hx    Rectal cancer Neg Hx    Stomach cancer Neg Hx     Social History   Socioeconomic History  Marital status: Married    Spouse name: Not on file   Number of children: 2   Years of education: Not on file   Highest education level: Not on file  Occupational History   Not on file  Tobacco Use   Smoking status: Never   Smokeless tobacco: Never  Vaping Use   Vaping Use: Never used  Substance and Sexual Activity   Alcohol use: No   Drug use: No   Sexual activity: Not on file  Other Topics Concern   Not on file  Social History Narrative   Not on file   Social Determinants of Health   Financial Resource Strain: Not on file  Food Insecurity: Not on file  Transportation Needs: Not on file  Physical Activity: Not on file  Stress: Not on file  Social Connections: Not on file  Intimate Partner Violence: Not on file     Review of Systems  Constitutional:  Negative for chills and fever.  Respiratory:  Negative for shortness of breath.   Cardiovascular:  Negative for chest pain.  Gastrointestinal:  Negative for abdominal pain, blood in stool, constipation, diarrhea, nausea and vomiting.       BM daily   Genitourinary:  Negative for dysuria and hematuria.       Nocturia 4-5 times a night   Neurological:  Negative for headaches.  Psychiatric/Behavioral:  Negative for hallucinations and suicidal ideas. The patient has insomnia.         Objective    BP (!) 190/98   Pulse 90   Temp 99.8 F (37.7 C)   Resp 16   Ht 5\' 9"  (1.753 m)   Wt 219 lb 6 oz (99.5 kg)   SpO2 96%   BMI 32.40 kg/m   Physical Exam Vitals and nursing note reviewed.  Constitutional:      Appearance: Normal appearance.  HENT:     Right Ear: Tympanic membrane, ear canal and external ear normal.     Left Ear: Tympanic membrane, ear canal and external ear normal.     Mouth/Throat:     Mouth: Mucous membranes are moist.     Pharynx: Oropharynx is clear.  Eyes:     Extraocular Movements: Extraocular movements intact.     Pupils: Pupils are equal, round, and reactive to light.  Cardiovascular:     Rate and Rhythm: Normal rate and regular rhythm.     Heart sounds: Normal heart sounds.  Pulmonary:     Effort: Pulmonary effort is normal.     Breath sounds: Normal breath sounds.  Musculoskeletal:        General: Swelling present. No tenderness.     Right lower leg: Edema present.     Left lower leg: Edema present.  Lymphadenopathy:     Cervical: No cervical adenopathy.  Neurological:     General: No focal deficit present.     Mental Status: He is alert.         Assessment & Plan:   Problem List Items Addressed This Visit       Cardiovascular and Mediastinum   Essential hypertension - Primary    Patient currently maintained on clonidine 0.2 mg twice daily, carvedilol 25 mg twice daily, losartan 100 mg daily.   Blood pressure still uncontrolled.  Will switch losartan to valsartan 160 mg.  Follow-up 1 month      Relevant Medications   cloNIDine (CATAPRES) 0.2 MG tablet   valsartan (DIOVAN) 160 MG tablet   Other Relevant  Orders   CBC   Comprehensive metabolic panel   TSH     Digestive   Gastroesophageal reflux disease    Patient currently on omeprazole 20 mg every other day. Symptoms controlled continue        Other   Hyperlipidemia    Patient on lovastatin 40mg . Continue       Relevant Medications   cloNIDine (CATAPRES) 0.2 MG tablet   valsartan (DIOVAN) 160 MG tablet   Other Relevant Orders   Hemoglobin A1c   History of gout    Maintained on allopurinol 100mg  BID. Continue as prescribed. Pending uric acid level      Relevant Medications   allopurinol (ZYLOPRIM) 100 MG tablet   Other Relevant Orders   Uric acid   Obesity (BMI 30-39.9)    Pending labs inclusive of TSH and A1C      Relevant Orders   Hemoglobin A1c   TSH   Other Visit Diagnoses     Screening for prostate cancer       Relevant Orders   PSA       Return in about 4 weeks (around 08/18/2022) for BP recheck.   Audria Nine, NP

## 2022-07-21 NOTE — Assessment & Plan Note (Signed)
Maintained on allopurinol 100mg  BID. Continue as prescribed. Pending uric acid level

## 2022-07-21 NOTE — Assessment & Plan Note (Signed)
Pending labs inclusive of TSH and A1C

## 2022-07-21 NOTE — Assessment & Plan Note (Signed)
Patient currently on omeprazole 20 mg every other day. Symptoms controlled continue

## 2022-07-21 NOTE — Patient Instructions (Signed)
Nice to see you today I want you to stop the LOSARTAN and I am going to start a new blood pressure medication valsartan Follow up with me in 1 month for a follow up

## 2022-07-21 NOTE — Assessment & Plan Note (Signed)
Patient currently maintained on clonidine 0.2 mg twice daily, carvedilol 25 mg twice daily, losartan 100 mg daily.  Blood pressure still uncontrolled.  Will switch losartan to valsartan 160 mg.  Follow-up 1 month

## 2022-07-22 LAB — COMPREHENSIVE METABOLIC PANEL
AG Ratio: 1.7 (calc) (ref 1.0–2.5)
ALT: 20 U/L (ref 9–46)
AST: 16 U/L (ref 10–35)
Albumin: 4.2 g/dL (ref 3.6–5.1)
Alkaline phosphatase (APISO): 87 U/L (ref 35–144)
BUN: 16 mg/dL (ref 7–25)
CO2: 29 mmol/L (ref 20–32)
Calcium: 9.2 mg/dL (ref 8.6–10.3)
Chloride: 105 mmol/L (ref 98–110)
Creat: 0.81 mg/dL (ref 0.70–1.28)
Globulin: 2.5 g/dL (calc) (ref 1.9–3.7)
Glucose, Bld: 196 mg/dL — ABNORMAL HIGH (ref 65–99)
Potassium: 4.3 mmol/L (ref 3.5–5.3)
Sodium: 139 mmol/L (ref 135–146)
Total Bilirubin: 0.7 mg/dL (ref 0.2–1.2)
Total Protein: 6.7 g/dL (ref 6.1–8.1)

## 2022-07-22 LAB — HEMOGLOBIN A1C
Hgb A1c MFr Bld: 7.9 % of total Hgb — ABNORMAL HIGH (ref <5.7)
Mean Plasma Glucose: 180 mg/dL
eAG (mmol/L): 10 mmol/L

## 2022-07-22 LAB — CBC
HCT: 41.7 % (ref 38.5–50.0)
Hemoglobin: 14 g/dL (ref 13.2–17.1)
MCH: 28.9 pg (ref 27.0–33.0)
MCHC: 33.6 g/dL (ref 32.0–36.0)
MCV: 86 fL (ref 80.0–100.0)
MPV: 10.8 fL (ref 7.5–12.5)
Platelets: 188 10*3/uL (ref 140–400)
RDW: 12.9 % (ref 11.0–15.0)

## 2022-07-22 LAB — URIC ACID: Uric Acid, Serum: 4 mg/dL (ref 4.0–8.0)

## 2022-07-22 LAB — TSH: TSH: 1.46 mIU/L (ref 0.40–4.50)

## 2022-07-22 LAB — PSA: PSA: <0.04 ng/mL (ref <=4.00)

## 2022-07-24 ENCOUNTER — Other Ambulatory Visit: Payer: Self-pay | Admitting: Nurse Practitioner

## 2022-07-24 DIAGNOSIS — E1165 Type 2 diabetes mellitus with hyperglycemia: Secondary | ICD-10-CM

## 2022-07-25 ENCOUNTER — Telehealth: Payer: Self-pay | Admitting: Nurse Practitioner

## 2022-07-25 DIAGNOSIS — E1165 Type 2 diabetes mellitus with hyperglycemia: Secondary | ICD-10-CM

## 2022-07-25 MED ORDER — METFORMIN HCL ER 500 MG PO TB24
500.0000 mg | ORAL_TABLET | Freq: Every day | ORAL | 1 refills | Status: DC
Start: 2022-07-25 — End: 2023-02-23

## 2022-07-25 NOTE — Telephone Encounter (Signed)
Estimated Creatinine Clearance: 98.7 mL/min (by C-G formula based on SCr of 0.81 mg/dL).

## 2022-07-25 NOTE — Telephone Encounter (Signed)
-----   Message from Vertis Kelch, [place] sent at 07/24/2022  4:48 PM EDT ----- Called patient reviewed all information and repeated back to me. Will call if any questions.  He is ok with starting medication. He wants it sent to [facility] mail order.

## 2022-08-02 ENCOUNTER — Telehealth: Payer: Self-pay | Admitting: Nurse Practitioner

## 2022-08-02 ENCOUNTER — Other Ambulatory Visit: Payer: Self-pay | Admitting: Nurse Practitioner

## 2022-08-02 MED ORDER — OMEPRAZOLE 40 MG PO CPDR: 40.0000 mg | DELAYED_RELEASE_CAPSULE | ORAL | 1 refills | Status: DC

## 2022-08-02 NOTE — Telephone Encounter (Signed)
Medication corrected and sent to the pharmacy

## 2022-08-02 NOTE — Telephone Encounter (Signed)
Prescription Request  08/02/2022  LOV: 07/21/2022  What is the name of the medication or equipment?  omeprazole (PRILOSEC) 20 MG capsule  Have you contacted your pharmacy to request a refill? No   Which pharmacy would you like this sent to?  [id number] Sheen, [place] - [address] [place] [address] Phone: [phone] Fax: [phone]    Patient notified that their request is being sent to the clinical staff for review and that they should receive a response within 2 business days.   Please advise at [facility] [phone]  Patient stated that he was prescribed this medication by previous doctor.MG is suppose to be 40 mg. He is prescribed to take every other day due to him having trouble swallowing.

## 2022-08-09 ENCOUNTER — Telehealth: Payer: Self-pay | Admitting: Nurse Practitioner

## 2022-08-09 NOTE — Telephone Encounter (Signed)
Called and let the pt know that it was to soon to fill. It will be available on June 9,2024.

## 2022-08-09 NOTE — Telephone Encounter (Signed)
Patient contacted the office regarding medication omeprazole, says he has contacted pharmacy to see if medication was ready for pickup. Pharmacy says they do not have this medication, on my end it shows medication was sent on 08/02/22 and receipt was confirmed by pharmacy. Informed patient of this and said I would send a message regarding this as well to see if  anything can be done. Please advise, thank you.

## 2022-08-13 ENCOUNTER — Other Ambulatory Visit: Payer: Self-pay | Admitting: Nurse Practitioner

## 2022-08-13 DIAGNOSIS — K219 Gastro-esophageal reflux disease without esophagitis: Secondary | ICD-10-CM

## 2022-08-13 DIAGNOSIS — E785 Hyperlipidemia, unspecified: Secondary | ICD-10-CM

## 2022-08-13 DIAGNOSIS — I10 Essential (primary) hypertension: Secondary | ICD-10-CM

## 2022-08-13 DIAGNOSIS — Z8739 Personal history of other diseases of the musculoskeletal system and connective tissue: Secondary | ICD-10-CM

## 2022-08-13 MED ORDER — CARVEDILOL 25 MG PO TABS: 25.0000 mg | ORAL_TABLET | Freq: Two times a day (BID) | ORAL | 1 refills | Status: DC

## 2022-08-13 MED ORDER — VALSARTAN 160 MG PO TABS
160.0000 mg | ORAL_TABLET | Freq: Every day | ORAL | 0 refills | Status: DC
Start: 2022-08-13 — End: 2022-08-24

## 2022-08-13 MED ORDER — CLONIDINE HCL 0.2 MG PO TABS
0.2000 mg | ORAL_TABLET | Freq: Two times a day (BID) | ORAL | 0 refills | Status: DC
Start: 2022-08-13 — End: 2022-11-15

## 2022-08-13 MED ORDER — OMEPRAZOLE 40 MG PO CPDR: 40.0000 mg | DELAYED_RELEASE_CAPSULE | ORAL | 1 refills | Status: DC

## 2022-08-13 MED ORDER — LOVASTATIN 40 MG PO TABS
40.0000 mg | ORAL_TABLET | Freq: Every day | ORAL | 1 refills | Status: DC
Start: 2022-08-13 — End: 2023-01-08

## 2022-08-13 MED ORDER — ALLOPURINOL 100 MG PO TABS: 100.0000 mg | ORAL_TABLET | Freq: Two times a day (BID) | ORAL | 0 refills | Status: DC

## 2022-08-23 ENCOUNTER — Ambulatory Visit: Payer: Medicare HMO | Admitting: Nurse Practitioner

## 2022-08-23 DIAGNOSIS — H52223 Regular astigmatism, bilateral: Secondary | ICD-10-CM | POA: Diagnosis not present

## 2022-08-23 DIAGNOSIS — Z135 Encounter for screening for eye and ear disorders: Secondary | ICD-10-CM | POA: Diagnosis not present

## 2022-08-23 DIAGNOSIS — H524 Presbyopia: Secondary | ICD-10-CM | POA: Diagnosis not present

## 2022-08-23 DIAGNOSIS — H5213 Myopia, bilateral: Secondary | ICD-10-CM | POA: Diagnosis not present

## 2022-08-24 ENCOUNTER — Encounter: Payer: Self-pay | Admitting: Nurse Practitioner

## 2022-08-24 ENCOUNTER — Ambulatory Visit: Payer: Medicare HMO | Admitting: Nurse Practitioner

## 2022-08-24 ENCOUNTER — Ambulatory Visit (INDEPENDENT_AMBULATORY_CARE_PROVIDER_SITE_OTHER): Payer: Medicare HMO | Admitting: Nurse Practitioner

## 2022-08-24 VITALS — BP 150/88 | HR 85 | Temp 98.0°F | Resp 16 | Ht 69.0 in | Wt 220.0 lb

## 2022-08-24 DIAGNOSIS — I10 Essential (primary) hypertension: Secondary | ICD-10-CM

## 2022-08-24 MED ORDER — VALSARTAN 160 MG PO TABS
320.0000 mg | ORAL_TABLET | Freq: Every day | ORAL | 0 refills | Status: DC
Start: 2022-08-24 — End: 2022-09-19

## 2022-08-24 NOTE — Assessment & Plan Note (Signed)
Patient currently maintained on carvedilol 25 mg twice daily, clonidine 0.2 mg twice daily and was on valsartan 160 mg daily blood pressure is improved but still above goal we will titrate valsartan to 320 mg daily.  Patient can take 2 tablets of the 160 mg to equal 320 until he finishes.  He just got in the mail.  He will follow-up in 2 months for blood pressure recheck along with diabetes recheck the patient gets lightheaded or dizzy he will call the office and we consider reducing on his other medicines

## 2022-08-24 NOTE — Progress Notes (Signed)
   Established Patient Office Visit  Subjective   Patient ID: Joseph Stanton, male    DOB: 1951-03-02  Age: 72 y.o. MRN: [id number]  Chief Complaint  Patient presents with   Hypertension      HTN: patient was seen by me on 07/21/2022 and had an elevated blood pressure. He does not have a cuff at home and cannot check his blood pressure. Patient is currently on clonidine 0.2mg  BID, carvedilol 25mg  BID and was on losartan but was switched to valsartan 160 last office visit. Patient has been taking the medications as prescribed. He is tolerating them well. He denies any lightheadedness or dizziness    Review of Systems  Constitutional:  Negative for chills and fever.  Respiratory:  Negative for shortness of breath.   Cardiovascular:  Negative for chest pain.  Neurological:  Negative for dizziness and headaches.      Objective:     BP (!) 150/88   Pulse 85   Temp 98 F (36.7 C)   Resp 16   Ht 5\' 9"  (1.753 m)   Wt 220 lb (99.8 kg)   SpO2 96%   BMI 32.49 kg/m    Physical Exam Vitals and nursing note reviewed.  Constitutional:      Appearance: Normal appearance.  Cardiovascular:     Rate and Rhythm: Normal rate and regular rhythm.     Heart sounds: Normal heart sounds.  Pulmonary:     Effort: Pulmonary effort is normal.     Breath sounds: Normal breath sounds.  Neurological:     Mental Status: He is alert.      No results found for any visits on 08/24/22.    The ASCVD Risk score (Arnett DK, et al., 2019) failed to calculate for the following reasons:   The patient has a prior MI or stroke diagnosis    Assessment & Plan:   Problem List Items Addressed This Visit       Cardiovascular and Mediastinum   Essential hypertension - Primary    Patient currently maintained on carvedilol 25 mg twice daily, clonidine 0.2 mg twice daily and was on valsartan 160 mg daily blood pressure is improved but still above goal we will titrate valsartan to 320 mg daily.   Patient can take 2 tablets of the 160 mg to equal 320 until he finishes.  He just got in the mail.  He will follow-up in 2 months for blood pressure recheck along with diabetes recheck the patient gets lightheaded or dizzy he will call the office and we consider reducing on his other medicines      Relevant Medications   valsartan (DIOVAN) 160 MG tablet    Return in about 2 months (around 10/24/2022) for DM recheck, BP recheck.    Audria Nine, NP

## 2022-08-24 NOTE — Patient Instructions (Signed)
Blood pressure is responding nicely to the medications.  I am going to increase the valsartan to 320mg  a day. If you just got the prescription of valsartan 160mg  you can take 2 of the 160mg  pills until you finish what you have.   If you become lightheaded or dizzy call the office and let me know   I want to see you in 2 months for a sugar recheck

## 2022-08-25 DIAGNOSIS — H52223 Regular astigmatism, bilateral: Secondary | ICD-10-CM | POA: Diagnosis not present

## 2022-08-25 DIAGNOSIS — H5213 Myopia, bilateral: Secondary | ICD-10-CM | POA: Diagnosis not present

## 2022-09-18 ENCOUNTER — Telehealth: Payer: Self-pay

## 2022-09-18 ENCOUNTER — Ambulatory Visit (INDEPENDENT_AMBULATORY_CARE_PROVIDER_SITE_OTHER): Payer: Medicare HMO | Admitting: Internal Medicine

## 2022-09-18 ENCOUNTER — Encounter: Payer: Self-pay | Admitting: Internal Medicine

## 2022-09-18 VITALS — BP 100/70 | HR 85 | Temp 97.7°F | Ht 69.0 in | Wt 220.0 lb

## 2022-09-18 DIAGNOSIS — H60312 Diffuse otitis externa, left ear: Secondary | ICD-10-CM | POA: Diagnosis not present

## 2022-09-18 DIAGNOSIS — I10 Essential (primary) hypertension: Secondary | ICD-10-CM

## 2022-09-18 DIAGNOSIS — H6092 Unspecified otitis externa, left ear: Secondary | ICD-10-CM | POA: Insufficient documentation

## 2022-09-18 MED ORDER — NEOMYCIN-POLYMYXIN-HC 3.5-10000-1 OT SOLN: 3.0000 [drp] | Freq: Four times a day (QID) | OTIC | 0 refills | Status: AC

## 2022-09-18 NOTE — Telephone Encounter (Signed)
Pt was in for a visit with Dr Alphonsus Sias for a problem. He is needing a new rx for valsartan. He has been taking valsartan 160 mg 2 tabs daily as directly. Needs a [facility] rx [facility] to [facility].

## 2022-09-18 NOTE — Progress Notes (Signed)
Subjective:    Patient ID: Joseph Stanton, male    DOB: 1950/12/14, 72 y.o.   MRN: [id number]  HPI Here due to ear pain and popping With son  Started with trouble in right ear a week ago--thought there may be some water Has persisted but not really painful Then noted worse pain in left ear --burning Some popping with dropping head or turning  No fever No recent swimming or travel  Current Outpatient Medications on File Prior to Visit  Medication Sig Dispense Refill   allopurinol (ZYLOPRIM) 100 MG tablet Take 1 tablet (100 mg total) by mouth 2 (two) times daily. 180 tablet 0   carvedilol (COREG) 25 MG tablet Take 1 tablet (25 mg total) by mouth 2 (two) times daily. 180 tablet 1   cloNIDine (CATAPRES) 0.2 MG tablet Take 1 tablet (0.2 mg total) by mouth 2 (two) times daily. 180 tablet 0   ketoconazole (NIZORAL) 2 % shampoo APPLY TO AFFECTED AREA TWICE A DAY  3   lovastatin (MEVACOR) 40 MG tablet Take 1 tablet (40 mg total) by mouth daily. 90 tablet 1   metFORMIN (GLUCOPHAGE-XR) 500 MG 24 hr tablet Take 1 tablet (500 mg total) by mouth daily with breakfast. 90 tablet 1   Multiple Vitamin (ESSENTIAL ONE DAILY PO) Take 1 capsule by mouth daily.     Multiple Vitamins-Minerals (HAIR/SKIN/NAILS/BIOTIN PO) Take 1 capsule by mouth daily.     omeprazole (PRILOSEC) 40 MG capsule Take 1 capsule (40 mg total) by mouth every other day. 30 capsule 1   valsartan (DIOVAN) 160 MG tablet Take 2 tablets (320 mg total) by mouth daily. 90 tablet 0   No current facility-administered medications on file prior to visit.    No Known Allergies  Past Medical History:  Diagnosis Date   Allergy    Arthritis    Cancer (HCC)    prostate   Deviated nasal septum    GERD (gastroesophageal reflux disease)    Gout    Hyperlipidemia    Hypertension    Spinal stenosis     Past Surgical History:  Procedure Laterality Date   ESOPHAGOGASTRODUODENOSCOPY (EGD) WITH PROPOFOL N/A 10/16/2017   Procedure:  ESOPHAGOGASTRODUODENOSCOPY (EGD) WITH PROPOFOL;  Surgeon: Sherrilyn Rist, MD;  Location: [facility] ENDOSCOPY;  Service: Gastroenterology;  Laterality: N/A;   FOREIGN BODY REMOVAL  10/16/2017   Procedure: FOREIGN BODY REMOVAL;  Surgeon: Sherrilyn Rist, MD;  Location: [facility] ENDOSCOPY;  Service: Gastroenterology;;   NASAL SEPTUM SURGERY     PROSTATECTOMY     SPINE SURGERY     UPPER GASTROINTESTINAL ENDOSCOPY      Family History  Problem Relation Age of Onset   Hypertension Mother    Hypertension Father    Colon cancer Neg Hx    Colon polyps Neg Hx    Esophageal cancer Neg Hx    Rectal cancer Neg Hx    Stomach cancer Neg Hx     Social History   Socioeconomic History   Marital status: Married    Spouse name: Not on file   Number of children: 2   Years of education: Not on file   Highest education level: Not on file  Occupational History   Not on file  Tobacco Use   Smoking status: Never   Smokeless tobacco: Never  Vaping Use   Vaping Use: Never used  Substance and Sexual Activity   Alcohol use: No   Drug use: No   Sexual activity: Not on  file  Other Topics Concern   Not on file  Social History Narrative   Not on file   Social Determinants of Health   Financial Resource Strain: Not on file  Food Insecurity: Not on file  Transportation Needs: Not on file  Physical Activity: Not on file  Stress: Not on file  Social Connections: Not on file  Intimate Partner Violence: Not on file   Review of Systems No vertigo No headache Did have 2 teeth removed on right lower 10 days ago (wisdom and next molar also)     Objective:   Physical Exam Constitutional:      Appearance: Normal appearance.  HENT:     Ears:     Comments: Slight tragal tenderness on right--with mild inflammation in canal (but not much cerumen) Right is obstructed with cerumen Neurological:     Mental Status: He is alert.            Assessment & Plan:

## 2022-09-18 NOTE — Assessment & Plan Note (Signed)
Will treat with cortisporin Clean out right ear

## 2022-09-19 MED ORDER — VALSARTAN 320 MG PO TABS
320.0000 mg | ORAL_TABLET | Freq: Every day | ORAL | 3 refills | Status: DC
Start: 2022-09-19 — End: 2023-09-03

## 2022-09-19 NOTE — Telephone Encounter (Signed)
Can we inform the patient that I am going to change the MG of the valsartan to 320mg  and he will need to ONLY take ONE tablet daily now

## 2022-09-19 NOTE — Telephone Encounter (Signed)
No answer not able to leave voice mail.

## 2022-09-19 NOTE — Addendum Note (Signed)
Addended by: Eden Emms on: 09/19/2022 01:21 PM   Modules accepted: Orders

## 2022-09-19 NOTE — Telephone Encounter (Signed)
Unable to reach patient. Number cannot be completed as dialed. Unable to leave a voicemail.

## 2022-09-25 NOTE — Telephone Encounter (Signed)
Called patient reviewed information will call if any questions.

## 2022-10-16 ENCOUNTER — Other Ambulatory Visit: Payer: Self-pay | Admitting: Nurse Practitioner

## 2022-10-16 DIAGNOSIS — K219 Gastro-esophageal reflux disease without esophagitis: Secondary | ICD-10-CM

## 2022-10-16 DIAGNOSIS — I10 Essential (primary) hypertension: Secondary | ICD-10-CM

## 2022-10-19 ENCOUNTER — Other Ambulatory Visit: Payer: Self-pay | Admitting: Nurse Practitioner

## 2022-10-19 DIAGNOSIS — I10 Essential (primary) hypertension: Secondary | ICD-10-CM

## 2022-10-30 ENCOUNTER — Ambulatory Visit: Payer: Medicare HMO | Admitting: Nurse Practitioner

## 2022-10-31 ENCOUNTER — Encounter: Payer: Self-pay | Admitting: Nurse Practitioner

## 2022-11-07 ENCOUNTER — Ambulatory Visit (INDEPENDENT_AMBULATORY_CARE_PROVIDER_SITE_OTHER): Payer: Medicare HMO

## 2022-11-07 VITALS — Ht 71.0 in | Wt 217.0 lb

## 2022-11-07 DIAGNOSIS — Z0000 Encounter for general adult medical examination without abnormal findings: Secondary | ICD-10-CM | POA: Diagnosis not present

## 2022-11-07 NOTE — Patient Instructions (Signed)
Joseph Stanton , Thank you for taking time to come for your Medicare Wellness Visit. I appreciate your ongoing commitment to your health goals. Please review the following plan we discussed and let me know if I can assist you in the future.   Referrals/Orders/Follow-Ups/Clinician Recommendations: Aim for 30 minutes of exercise or brisk walking, 6-8 glasses of water, and 5 servings of fruits and vegetables each day.   This is a list of the screening recommended for you and due dates:  Health Maintenance  Topic Date Due   Medicare Annual Wellness Visit  Never done   COVID-19 Vaccine (1) Never done   Yearly kidney health urinalysis for diabetes  Never done   Hepatitis C Screening  Never done   DTaP/Tdap/Td vaccine (1 - Tdap) Never done   Colon Cancer Screening  Never done   Pneumonia Vaccine (1 of 1 - PCV) Never done   Flu Shot  10/12/2022   Yearly kidney function blood test for diabetes  07/21/2023   Zoster (Shingles) Vaccine  Completed   HPV Vaccine  Aged Out    Advanced directives: (Declined) Advance directive discussed with you today. Even though you declined this today, please call our office should you change your mind, and we can give you the proper paperwork for you to fill out.  Next Medicare Annual Wellness Visit scheduled for next year: Yes

## 2022-11-07 NOTE — Progress Notes (Signed)
Subjective:   Joseph Stanton is a 72 y.o. male who presents for Medicare Annual/Subsequent preventive examination.  Visit Complete: Virtual  I connected with  Joseph Stanton on 11/07/22 by a audio enabled telemedicine application and verified that I am speaking with the correct person using two identifiers.  Patient Location: Home  Provider Location: Home Office  I discussed the limitations of evaluation and management by telemedicine. The patient  expressed understanding and agreed to proceed.  Vital Signs: Because this visit was a virtual/telehealth visit, some criteria may be missing or patient reported. Any vitals not documented were not able to be obtained and vitals that have been documented are patient reported.    Review of Systems      Cardiac Risk Factors include: advanced age (>43men, >61 women);hypertension;dyslipidemia;sedentary lifestyle;obesity (BMI >30kg/m2);male gender     Objective:    Today's Vitals   11/07/22 1430  Weight: 217 lb (98.4 kg)  Height: 5\' 11"  (1.803 m)   Body mass index is 30.27 kg/m.     11/07/2022    2:41 PM 10/15/2017    6:06 PM  Advanced Directives  Does Patient Have a Medical Advance Directive? No No  Would patient like information on creating a medical advance directive? No - Patient declined No - Patient declined    Current Medications (verified) Outpatient Encounter Medications as of 11/07/2022  Medication Sig   allopurinol (ZYLOPRIM) 100 MG tablet Take 1 tablet (100 mg total) by mouth 2 (two) times daily.   amLODipine (NORVASC) 5 MG tablet Take 5 mg by mouth daily.   carvedilol (COREG) 25 MG tablet Take 1 tablet (25 mg total) by mouth 2 (two) times daily.   cloNIDine (CATAPRES) 0.2 MG tablet Take 1 tablet (0.2 mg total) by mouth 2 (two) times daily.   ketoconazole (NIZORAL) 2 % shampoo APPLY TO AFFECTED AREA TWICE A DAY   lovastatin (MEVACOR) 40 MG tablet Take 1 tablet (40 mg total) by mouth daily.   Multiple Vitamin (ESSENTIAL  ONE DAILY PO) Take 1 capsule by mouth daily.   Multiple Vitamins-Minerals (HAIR/SKIN/NAILS/BIOTIN PO) Take 1 capsule by mouth daily.   omeprazole (PRILOSEC) 40 MG capsule TAKE 1 CAPSULE (40 MG TOTAL) BY MOUTH EVERY OTHER DAY   valsartan (DIOVAN) 320 MG tablet Take 1 tablet (320 mg total) by mouth daily.   metFORMIN (GLUCOPHAGE-XR) 500 MG 24 hr tablet Take 1 tablet (500 mg total) by mouth daily with breakfast. (Patient not taking: Reported on 11/07/2022)   neomycin-polymyxin-hydrocortisone (CORTISPORIN) OTIC solution Place 3 drops into the left ear 4 (four) times daily. (Patient not taking: Reported on 11/07/2022)   No facility-administered encounter medications on file as of 11/07/2022.    Allergies (verified) Patient has no known allergies.   History: Past Medical History:  Diagnosis Date   Allergy    Arthritis    Cancer (HCC)    prostate   Deviated nasal septum    GERD (gastroesophageal reflux disease)    Gout    Hyperlipidemia    Hypertension    Spinal stenosis    Past Surgical History:  Procedure Laterality Date   ESOPHAGOGASTRODUODENOSCOPY (EGD) WITH PROPOFOL N/A 10/16/2017   Procedure: ESOPHAGOGASTRODUODENOSCOPY (EGD) WITH PROPOFOL;  Surgeon: Sherrilyn Rist, MD;  Location: [facility] ENDOSCOPY;  Service: Gastroenterology;  Laterality: N/A;   FOREIGN BODY REMOVAL  10/16/2017   Procedure: FOREIGN BODY REMOVAL;  Surgeon: Sherrilyn Rist, MD;  Location: [facility] ENDOSCOPY;  Service: Gastroenterology;;   NASAL SEPTUM SURGERY  PROSTATECTOMY     SPINE SURGERY     UPPER GASTROINTESTINAL ENDOSCOPY     Family History  Problem Relation Age of Onset   Hypertension Mother    Hypertension Father    Colon cancer Neg Hx    Colon polyps Neg Hx    Esophageal cancer Neg Hx    Rectal cancer Neg Hx    Stomach cancer Neg Hx    Social History   Socioeconomic History   Marital status: Married    Spouse name: Not on file   Number of children: 2   Years of education: Not on file   Highest  education level: Not on file  Occupational History   Not on file  Tobacco Use   Smoking status: Never   Smokeless tobacco: Never  Vaping Use   Vaping status: Never Used  Substance and Sexual Activity   Alcohol use: No   Drug use: No   Sexual activity: Not on file  Other Topics Concern   Not on file  Social History Narrative   Not on file   Social Determinants of Health   Financial Resource Strain: Low Risk  (11/07/2022)   Overall Financial Resource Strain (CARDIA)    Difficulty of Paying Living Expenses: Not hard at all  Food Insecurity: No Food Insecurity (11/07/2022)   Hunger Vital Sign    Worried About Running Out of Food in the Last Year: Never true    Ran Out of Food in the Last Year: Never true  Transportation Needs: No Transportation Needs (11/07/2022)   PRAPARE - Administrator, Civil Service (Medical): No    Lack of Transportation (Non-Medical): No  Physical Activity: Inactive (11/07/2022)   Exercise Vital Sign    Days of Exercise per Week: 0 days    Minutes of Exercise per Session: 0 min  Stress: No Stress Concern Present (11/07/2022)   [employer] of Occupational Health - Occupational Stress Questionnaire    Feeling of Stress : Not at all  Social Connections: Moderately Isolated (11/07/2022)   Social Connection and Isolation Panel [NHANES]    Frequency of Communication with Friends and Family: More than three times a week    Frequency of Social Gatherings with Friends and Family: More than three times a week    Attends Religious Services: Never    Database administrator or Organizations: No    Attends Engineer, structural: Never    Marital Status: Married    Tobacco Counseling Counseling given: Not Answered   Clinical Intake:  Pre-visit preparation completed: Yes  Pain : No/denies pain     BMI - recorded: 30.27 Nutritional Status: BMI > 30  Obese Nutritional Risks: None Diabetes: No  How often do you need to have someone  help you when you read instructions, pamphlets, or other written materials from your doctor or pharmacy?: 1 - Never  Interpreter Needed?: No  Information entered by :: C.Giovanie Lefebre LPN   Activities of Daily Living    11/07/2022    2:42 PM  In your present state of health, do you have any difficulty performing the following activities:  Hearing? 0  Vision? 0  Difficulty concentrating or making decisions? 0  Walking or climbing stairs? 0  Dressing or bathing? 0  Doing errands, shopping? 0  Preparing Food and eating ? N  Using the Toilet? N  In the past six months, have you accidently leaked urine? Y  Comment occasionally  Do you have problems with  loss of bowel control? N  Managing your Medications? N  Managing your Finances? N  Housekeeping or managing your Housekeeping? N    Patient Care Team: Eden Emms, NP as PCP - General (Nurse Practitioner)  Indicate any recent Medical Services you may have received from other than [facility] providers in the past year (date may be approximate).     Assessment:   This is a routine wellness examination for [facility].  Hearing/Vision screen Hearing Screening - Comments:: Denies hearing difficulties   Vision Screening - Comments:: Glasses - [facility] - UTD on eye exams  Dietary issues and exercise activities discussed:     Goals Addressed             This Visit's Progress    Patient Stated       Maintain current health status       Depression Screen    11/07/2022    2:32 PM 07/21/2022    2:54 PM  PHQ 2/9 Scores  PHQ - 2 Score 0 0  PHQ- 9 Score  3    Fall Risk    11/07/2022    2:34 PM 07/21/2022    2:54 PM 07/21/2022    2:53 PM  Fall Risk   Falls in the past year? 0 0 0  Number falls in past yr: 0 0 0  Injury with Fall? 0 0 0  Risk for fall due to : No Fall Risks No Fall Risks No Fall Risks  Follow up Falls prevention discussed;Falls evaluation completed Falls evaluation completed Falls evaluation completed     MEDICARE RISK AT HOME: Medicare Risk at Home Any stairs in or around the home?: No If so, are there any without handrails?: No Home free of loose throw rugs in walkways, pet beds, electrical cords, etc?: Yes Adequate lighting in your home to reduce risk of falls?: Yes Life alert?: No Use of a cane, walker or w/c?: No Grab bars in the bathroom?: Yes Shower chair or bench in shower?: Yes Elevated toilet seat or a handicapped toilet?: Yes  TIMED UP AND GO:  Was the test performed?  No    Cognitive Function:        11/07/2022    2:43 PM  6CIT Screen  What Year? 0 points  What month? 0 points  What time? 0 points  Count back from 20 0 points  Months in reverse 0 points  Repeat phrase 2 points  Total Score 2 points    Immunizations Immunization History  Administered Date(s) Administered   Influenza Inj Mdck Quad Pf 12/29/2016   Zoster Recombinant(Shingrix) 12/30/2018, 04/04/2019    TDAP status: Due, Education has been provided regarding the importance of this vaccine. Advised may receive this vaccine at local pharmacy or Health Dept. Aware to provide a copy of the vaccination record if obtained from local pharmacy or Health Dept. Verbalized acceptance and understanding.  Flu Vaccine status: Due, Education has been provided regarding the importance of this vaccine. Advised may receive this vaccine at local pharmacy or Health Dept. Aware to provide a copy of the vaccination record if obtained from local pharmacy or Health Dept. Verbalized acceptance and understanding.  Pneumococcal vaccine status: Due, Education has been provided regarding the importance of this vaccine. Advised may receive this vaccine at local pharmacy or Health Dept. Aware to provide a copy of the vaccination record if obtained from local pharmacy or Health Dept. Verbalized acceptance and understanding.  Covid-19 vaccine status: Information provided on how  to obtain vaccines.   Qualifies for Shingles  Vaccine? Yes   Zostavax completed      Shingrix Completed?: Yes  Screening Tests Health Maintenance  Topic Date Due   COVID-19 Vaccine (1) Never done   Diabetic kidney evaluation - Urine ACR  Never done   Hepatitis C Screening  Never done   DTaP/Tdap/Td (1 - Tdap) Never done   Colonoscopy  Never done   Pneumonia Vaccine 61+ Years old (1 of 1 - PCV) Never done   INFLUENZA VACCINE  10/12/2022   Diabetic kidney evaluation - eGFR measurement  07/21/2023   Medicare Annual Wellness (AWV)  11/07/2023   Zoster Vaccines- Shingrix  Completed   HPV VACCINES  Aged Out    Health Maintenance  Health Maintenance Due  Topic Date Due   COVID-19 Vaccine (1) Never done   Diabetic kidney evaluation - Urine ACR  Never done   Hepatitis C Screening  Never done   DTaP/Tdap/Td (1 - Tdap) Never done   Colonoscopy  Never done   Pneumonia Vaccine 40+ Years old (1 of 1 - PCV) Never done   INFLUENZA VACCINE  10/12/2022    Colorectal cancer screening: Type of screening: Colonoscopy. Completed 2022 per pt was normal. Repeat every 10 years per pt.  Lung Cancer Screening: (Low Dose CT Chest recommended if Age 62-80 years, 20 pack-year currently smoking OR have quit w/in 15years.) does not qualify.   Lung Cancer Screening Referral:    Additional Screening:  Hepatitis C Screening: does qualify; Completed DUE  Vision Screening: Recommended annual ophthalmology exams for early detection of glaucoma and other disorders of the eye. Is the patient up to date with their annual eye exam?  Yes  Who is the provider or what is the name of the office in which the patient attends annual eye exams? [facility] If pt is not established with a provider, would they like to be referred to a provider to establish care? Yes .   Dental Screening: Recommended annual dental exams for proper oral hygiene    Community Resource Referral / Chronic Care Management: CRR required this visit?  No   CCM required this visit?   No     Plan:     I have personally reviewed and noted the following in the patient's chart:   Medical and social history Use of alcohol, tobacco or illicit drugs  Current medications and supplements including opioid prescriptions. Patient is not currently taking opioid prescriptions. Functional ability and status Nutritional status Physical activity Advanced directives List of other physicians Hospitalizations, surgeries, and ER visits in previous 12 months Vitals Screenings to include cognitive, depression, and falls Referrals and appointments  In addition, I have reviewed and discussed with patient certain preventive protocols, quality metrics, and best practice recommendations. A written personalized care plan for preventive services as well as general preventive health recommendations were provided to patient.     Maryan Puls, LPN   [id number]   After Visit Summary: (MyChart) Due to this being a telephonic visit, the after visit summary with patients personalized plan was offered to patient via MyChart   Nurse Notes: Pt stated medical records from previous PCP have been requested.

## 2022-11-15 ENCOUNTER — Other Ambulatory Visit: Payer: Self-pay | Admitting: Nurse Practitioner

## 2022-11-15 DIAGNOSIS — I10 Essential (primary) hypertension: Secondary | ICD-10-CM

## 2023-01-07 ENCOUNTER — Other Ambulatory Visit: Payer: Self-pay | Admitting: Nurse Practitioner

## 2023-01-07 DIAGNOSIS — E785 Hyperlipidemia, unspecified: Secondary | ICD-10-CM

## 2023-01-07 DIAGNOSIS — I10 Essential (primary) hypertension: Secondary | ICD-10-CM

## 2023-02-12 ENCOUNTER — Telehealth: Payer: Self-pay | Admitting: Nurse Practitioner

## 2023-02-12 NOTE — Telephone Encounter (Signed)
Prescription Request  02/12/2023  LOV: 08/24/2022  What is the name of the medication or equipment? omeprazole (PRILOSEC) 40 MG capsule    Have you contacted your pharmacy to request a refill? No   Which pharmacy would you like this sent to?  [id number], [place] - [address] [place] [address] Phone: [phone] Fax: [phone]    Patient notified that their request is being sent to the clinical staff for review and that they should receive a response within 2 business days.   Please advise at [facility] [phone]

## 2023-02-12 NOTE — Telephone Encounter (Signed)
Patient should have a refill at the pharmacy as I wrote it for 6 months

## 2023-02-12 NOTE — Telephone Encounter (Signed)
Called patient he will check with pharmacy

## 2023-02-23 ENCOUNTER — Ambulatory Visit (INDEPENDENT_AMBULATORY_CARE_PROVIDER_SITE_OTHER): Payer: Medicare HMO | Admitting: Nurse Practitioner

## 2023-02-23 VITALS — BP 148/92 | HR 77 | Temp 97.6°F | Wt 221.4 lb

## 2023-02-23 DIAGNOSIS — Z7984 Long term (current) use of oral hypoglycemic drugs: Secondary | ICD-10-CM | POA: Diagnosis not present

## 2023-02-23 DIAGNOSIS — K219 Gastro-esophageal reflux disease without esophagitis: Secondary | ICD-10-CM | POA: Diagnosis not present

## 2023-02-23 DIAGNOSIS — I10 Essential (primary) hypertension: Secondary | ICD-10-CM

## 2023-02-23 DIAGNOSIS — E1165 Type 2 diabetes mellitus with hyperglycemia: Secondary | ICD-10-CM | POA: Diagnosis not present

## 2023-02-23 DIAGNOSIS — Z794 Long term (current) use of insulin: Secondary | ICD-10-CM | POA: Diagnosis not present

## 2023-02-23 DIAGNOSIS — Z8739 Personal history of other diseases of the musculoskeletal system and connective tissue: Secondary | ICD-10-CM | POA: Diagnosis not present

## 2023-02-23 LAB — POCT GLYCOSYLATED HEMOGLOBIN (HGB A1C): Hemoglobin A1C: 9 % — AB (ref 4.0–5.6)

## 2023-02-23 MED ORDER — AMLODIPINE BESYLATE 5 MG PO TABS
5.0000 mg | ORAL_TABLET | Freq: Every day | ORAL | 1 refills | Status: DC
Start: 2023-02-23 — End: 2023-08-20

## 2023-02-23 MED ORDER — OMEPRAZOLE 40 MG PO CPDR
40.0000 mg | DELAYED_RELEASE_CAPSULE | ORAL | 1 refills | Status: DC
Start: 2023-02-23 — End: 2023-12-06

## 2023-02-23 MED ORDER — BLOOD GLUCOSE MONITORING SUPPL DEVI: 1.0000 | 0 refills | Status: AC

## 2023-02-23 MED ORDER — BLOOD GLUCOSE TEST VI STRP: 1.0000 | ORAL_STRIP | Freq: Every day | 0 refills | Status: AC

## 2023-02-23 MED ORDER — LANCETS MISC. MISC: 1.0000 | Freq: Every day | 0 refills | Status: AC

## 2023-02-23 MED ORDER — LANCET DEVICE MISC: 1.0000 | Freq: Every day | 0 refills | Status: AC

## 2023-02-23 MED ORDER — ALLOPURINOL 100 MG PO TABS
100.0000 mg | ORAL_TABLET | Freq: Two times a day (BID) | ORAL | 1 refills | Status: DC
Start: 2023-02-23 — End: 2023-08-20

## 2023-02-23 MED ORDER — METFORMIN HCL ER 500 MG PO TB24
1000.0000 mg | ORAL_TABLET | Freq: Every day | ORAL | 1 refills | Status: DC
Start: 2023-02-23 — End: 2023-08-20

## 2023-02-23 NOTE — Addendum Note (Signed)
Addended by: Melina Copa on: 02/23/2023 04:03 PM   Modules accepted: Orders

## 2023-02-23 NOTE — Assessment & Plan Note (Signed)
Historically patient on metformin 500 mg daily.  A1c was above 7 last office visit.  A1c 9.0 today.  Will start metformin 500 mg extended release 2 tabs in the morning.  Patient supplies sent in for glucose monitoring.  Patient can do it 2-3 times a week and as needed.  Enough supplies written for daily checks

## 2023-02-23 NOTE — Assessment & Plan Note (Signed)
Patient request refill on allopurinol refill provided today

## 2023-02-23 NOTE — Patient Instructions (Signed)
Nice to see you today I want to see you in 3 months for a recheck on blood pressure and sugar levels You can check your sugar 2 times a week. More often if needed

## 2023-02-23 NOTE — Assessment & Plan Note (Signed)
Patient request refill omeprazole that he takes every other day.  Refill provided

## 2023-02-23 NOTE — Progress Notes (Signed)
Established Patient Office Visit  Subjective   Patient ID: Joseph Stanton, male    DOB: 21-Oct-1950  Age: 72 y.o. MRN: [id number]  Chief Complaint  Patient presents with   Hypertension   Diabetes   Medication Refill    Omeprazole  and allopurinol        DM2: Patient currently maintained on metformin 500 mg daily. States that he has not been on the metformin.  States that he did not have breakfast today Does not check his sugar at home   Hypertension: Patient currently maintained on amlodipine 5 mg, carvedilol 25 mg twice daily, clonidine 0.2 mg twice daily, valsartan 320 mg daily. Not on the amlodipine or clonidine currently.  Patient states he feels good denies any lightheadedness or dizziness.      Review of Systems  Constitutional:  Negative for chills and fever.  Respiratory:  Negative for shortness of breath.   Cardiovascular:  Negative for chest pain.  Gastrointestinal:  Negative for abdominal pain.       Bm daily   Neurological:  Negative for dizziness and headaches.  Psychiatric/Behavioral:  Negative for hallucinations and suicidal ideas.       Objective:     BP (!) 148/92   Pulse 77   Temp 97.6 F (36.4 C) (Oral)   Wt 221 lb 6.4 oz (100.4 kg)   SpO2 98%   BMI 30.88 kg/m  BP Readings from Last 3 Encounters:  02/23/23 (!) 148/92  09/18/22 100/70  08/24/22 (!) 150/88   Wt Readings from Last 3 Encounters:  02/23/23 221 lb 6.4 oz (100.4 kg)  11/07/22 217 lb (98.4 kg)  09/18/22 220 lb (99.8 kg)   SpO2 Readings from Last 3 Encounters:  02/23/23 98%  09/18/22 95%  08/24/22 96%      Physical Exam Vitals and nursing note reviewed.  Constitutional:      Appearance: Normal appearance.  Cardiovascular:     Rate and Rhythm: Normal rate and regular rhythm.     Heart sounds: Normal heart sounds.  Pulmonary:     Effort: Pulmonary effort is normal.     Breath sounds: Normal breath sounds.  Abdominal:     General: Bowel sounds are normal.   Neurological:     Mental Status: He is alert.      No results found for any visits on 02/23/23.    The ASCVD Risk score (Arnett DK, et al., 2019) failed to calculate for the following reasons:   Risk score cannot be calculated because patient has a medical history suggesting prior/existing ASCVD    Assessment & Plan:   Problem List Items Addressed This Visit       Cardiovascular and Mediastinum   Essential hypertension - Primary   Patient currently maintained on valsartan 320 mg, carvedilol 25 mg twice daily.  Patient had clonidine and amlodipine on his medication list that he had not been taking.  Discontinue clonidine add back on amlodipine 5 mg daily.  Follow-up 3 months for blood pressure recheck      Relevant Medications   amLODipine (NORVASC) 5 MG tablet     Digestive   Gastroesophageal reflux disease   Patient request refill omeprazole that he takes every other day.  Refill provided      Relevant Medications   omeprazole (PRILOSEC) 40 MG capsule     Endocrine   Type 2 diabetes mellitus with hyperglycemia, without long-term current use of insulin (HCC)   Historically patient on metformin 500 mg daily.  A1c was above 7 last office visit.  A1c 9.0 today.  Will start metformin 500 mg extended release 2 tabs in the morning.  Patient supplies sent in for glucose monitoring.  Patient can do it 2-3 times a week and as needed.  Enough supplies written for daily checks      Relevant Medications   metFORMIN (GLUCOPHAGE-XR) 500 MG 24 hr tablet   Blood Glucose Monitoring Suppl DEVI   Glucose Blood (BLOOD GLUCOSE TEST STRIPS) STRP   Lancet Device MISC   Lancets Misc. MISC     Other   History of gout   Patient request refill on allopurinol refill provided today      Relevant Medications   allopurinol (ZYLOPRIM) 100 MG tablet    Return in about 3 months (around 05/24/2023) for DM recheck, BP recheck.    Audria Nine, NP

## 2023-02-23 NOTE — Assessment & Plan Note (Signed)
Patient currently maintained on valsartan 320 mg, carvedilol 25 mg twice daily.  Patient had clonidine and amlodipine on his medication list that he had not been taking.  Discontinue clonidine add back on amlodipine 5 mg daily.  Follow-up 3 months for blood pressure recheck

## 2023-02-28 ENCOUNTER — Other Ambulatory Visit: Payer: Self-pay | Admitting: Nurse Practitioner

## 2023-02-28 ENCOUNTER — Telehealth: Payer: Self-pay

## 2023-02-28 DIAGNOSIS — K219 Gastro-esophageal reflux disease without esophagitis: Secondary | ICD-10-CM

## 2023-02-28 NOTE — Telephone Encounter (Signed)
Medication was [facility] in on 02/23/[facility]23 to [facility]

## 2023-02-28 NOTE — Telephone Encounter (Signed)
Copied from CRM [id number]. Topic: Clinical - Prescription Issue >> Feb 28, 2023  3:56 PM Sim Boast F wrote: **Med on list crm not routing to clinical pool - Advised to send refill request via CRM*  Medication: omeprazole    Has the patient contacted their pharmacy? Yes  (Agent: If no, request that the patient contact the pharmacy for the refill. If patient does not wish to contact the pharmacy document the reason why and proceed with request.)  (Agent: If yes, when and what did the pharmacy advise?)    Is this the correct pharmacy for this prescription? Yes  If no, delete pharmacy and type the correct one.  This is the patient's preferred pharmacy:    [phone] [place] - [address]  [address]  [place] [address]  Phone: [phone] Fax: [phone]        Has the prescription been filled recently? Yes    Is the patient out of the medication? No    Has the patient been seen for an appointment in the last year OR does the patient have an upcoming appointment? Yes    Can we respond through MyChart? No

## 2023-02-28 NOTE — Telephone Encounter (Signed)
Contacted pharmacy. Pharmacy staff stated that insurance states too early to fill. Last fill date was 12/23/22 so the next fill date will be 03/25/23. Will pt need a new script?

## 2023-03-01 ENCOUNTER — Other Ambulatory Visit: Payer: Self-pay | Admitting: Nurse Practitioner

## 2023-03-01 DIAGNOSIS — K219 Gastro-esophageal reflux disease without esophagitis: Secondary | ICD-10-CM

## 2023-03-01 NOTE — Telephone Encounter (Signed)
Should not need a new script if he is taking it every other day

## 2023-03-02 NOTE — Telephone Encounter (Signed)
Contacted pt. States that he takes every other day and has enough pills to last until next script. No further questions or concerns.

## 2023-05-24 ENCOUNTER — Ambulatory Visit: Payer: Medicare HMO | Admitting: Nurse Practitioner

## 2023-05-25 ENCOUNTER — Other Ambulatory Visit: Payer: Self-pay | Admitting: Nurse Practitioner

## 2023-05-25 DIAGNOSIS — I10 Essential (primary) hypertension: Secondary | ICD-10-CM

## 2023-06-01 ENCOUNTER — Ambulatory Visit: Payer: Medicare HMO | Admitting: Nurse Practitioner

## 2023-06-11 ENCOUNTER — Ambulatory Visit: Admitting: Nurse Practitioner

## 2023-06-12 ENCOUNTER — Encounter: Payer: Self-pay | Admitting: Nurse Practitioner

## 2023-06-27 ENCOUNTER — Ambulatory Visit (INDEPENDENT_AMBULATORY_CARE_PROVIDER_SITE_OTHER): Admitting: Nurse Practitioner

## 2023-06-27 VITALS — BP 130/70 | HR 72 | Temp 98.2°F | Ht 71.0 in | Wt 216.0 lb

## 2023-06-27 DIAGNOSIS — Z7984 Long term (current) use of oral hypoglycemic drugs: Secondary | ICD-10-CM | POA: Diagnosis not present

## 2023-06-27 DIAGNOSIS — E1165 Type 2 diabetes mellitus with hyperglycemia: Secondary | ICD-10-CM | POA: Diagnosis not present

## 2023-06-27 DIAGNOSIS — Z125 Encounter for screening for malignant neoplasm of prostate: Secondary | ICD-10-CM | POA: Diagnosis not present

## 2023-06-27 DIAGNOSIS — I10 Essential (primary) hypertension: Secondary | ICD-10-CM | POA: Diagnosis not present

## 2023-06-27 LAB — POCT GLYCOSYLATED HEMOGLOBIN (HGB A1C): Hemoglobin A1C: 7.3 % — AB (ref 4.0–5.6)

## 2023-06-27 NOTE — Progress Notes (Signed)
 Established Patient Office Visit  Subjective   Patient ID: Joseph Stanton, male    DOB: 08-Sep-1950  Age: 73 y.o. MRN: [id number]  Chief Complaint  Patient presents with   Diabetes   Hypertension      DM2: patient is currently maintained on metformin 500mg  XR 2 tabs daily. Last A1C was 9.0.  He does not hcekc his blood glucose at home. He has the machine but not strips.  States the strips are not covered by his insurance.  Patient has been tolerating the metformin XR 2 tablets daily.  HTN: patient is currently on amlodipine 5, carvedilol 25, and valsartan 320mg  daily.  Does not check blood pressure at home.  Patient is tolerating the medication well.  He denies lightheadedness or dizziness.    Review of Systems  Constitutional:  Negative for chills and fever.  Respiratory:  Negative for shortness of breath.   Cardiovascular:  Negative for chest pain.  Neurological:  Negative for headaches.  Psychiatric/Behavioral:  Negative for hallucinations and suicidal ideas.       Objective:     BP 130/70   Pulse 72   Temp 98.2 F (36.8 C) (Oral)   Ht 5\' 11"  (1.803 m)   Wt 216 lb (98 kg)   SpO2 97%   BMI 30.13 kg/m  BP Readings from Last 3 Encounters:  06/27/23 130/70  02/23/23 (!) 148/92  09/18/22 100/70   Wt Readings from Last 3 Encounters:  06/27/23 216 lb (98 kg)  02/23/23 221 lb 6.4 oz (100.4 kg)  11/07/22 217 lb (98.4 kg)   SpO2 Readings from Last 3 Encounters:  06/27/23 97%  02/23/23 98%  09/18/22 95%      Physical Exam Vitals and nursing note reviewed.  Constitutional:      Appearance: Normal appearance.  Cardiovascular:     Rate and Rhythm: Normal rate and regular rhythm.     Heart sounds: Normal heart sounds.  Pulmonary:     Effort: Pulmonary effort is normal.     Breath sounds: Normal breath sounds.  Abdominal:     General: Bowel sounds are normal.  Neurological:     Mental Status: He is alert.      Results for orders placed or performed in  visit on 06/27/23  POCT glycosylated hemoglobin (Hb A1C)  Result Value Ref Range   Hemoglobin A1C 7.3 (A) 4.0 - 5.6 %   HbA1c POC (<> result, manual entry)     HbA1c, POC (prediabetic range)     HbA1c, POC (controlled diabetic range)        The ASCVD Risk score (Arnett DK, et al., 2019) failed to calculate for the following reasons:   Risk score cannot be calculated because patient has a medical history suggesting prior/existing ASCVD    Assessment & Plan:   Problem List Items Addressed This Visit       Cardiovascular and Mediastinum   Essential hypertension   Patient currently maintained on valsartan 320 mg daily, carvedilol 25 mg twice daily, amlodipine 5 mg daily.  Blood pressure is well-controlled.  Continue medication as prescribed      Relevant Medications   cloNIDine (CATAPRES) 0.2 MG tablet   Other Relevant Orders   CBC   Comprehensive metabolic panel with GFR   TSH     Endocrine   Type 2 diabetes mellitus with hyperglycemia, without long-term current use of insulin (HCC) - Primary   Patient currently maintained on metformin 500 mg XR 2 tablets daily.  A1c  is trending down from 9.0 to 7.3%.  Patient has also lost weight.  Will continue metformin 500 mg XR 2 tablets daily and patient to continue working lifestyle modifications.      Relevant Orders   POCT glycosylated hemoglobin (Hb A1C) (Completed)   CBC   Comprehensive metabolic panel with GFR   Lipid panel   Microalbumin / creatinine urine ratio   Other Visit Diagnoses       Screening for prostate cancer       Relevant Orders   PSA, Medicare       Return in about 4 months (around 10/27/2023) for CPE .    Margarie Shay, NP

## 2023-06-27 NOTE — Assessment & Plan Note (Signed)
 Patient currently maintained on valsartan 320 mg daily, carvedilol 25 mg twice daily, amlodipine 5 mg daily.  Blood pressure is well-controlled.  Continue medication as prescribed

## 2023-06-27 NOTE — Assessment & Plan Note (Signed)
 Patient currently maintained on metformin 500 mg XR 2 tablets daily.  A1c is trending down from 9.0 to 7.3%.  Patient has also lost weight.  Will continue metformin 500 mg XR 2 tablets daily and patient to continue working lifestyle modifications.

## 2023-06-27 NOTE — Patient Instructions (Signed)
Nice to see you today I will be in touch with the labs once I have them  Follow up with me in 4 months, sooner if you need me

## 2023-06-28 LAB — CBC
HCT: 40.7 % (ref 39.0–52.0)
Hemoglobin: 13.7 g/dL (ref 13.0–17.0)
MCHC: 33.6 g/dL (ref 30.0–36.0)
MCV: 88 fl (ref 78.0–100.0)
Platelets: 205 10*3/uL (ref 150.0–400.0)
RBC: 4.62 Mil/uL (ref 4.22–5.81)
RDW: 14.2 % (ref 11.5–15.5)
WBC: 6.9 10*3/uL (ref 4.0–10.5)

## 2023-06-28 LAB — COMPREHENSIVE METABOLIC PANEL WITH GFR
ALT: 20 U/L (ref 0–53)
AST: 17 U/L (ref 0–37)
Albumin: 4.4 g/dL (ref 3.5–5.2)
Alkaline Phosphatase: 68 U/L (ref 39–117)
BUN: 9 mg/dL (ref 6–23)
CO2: 31 meq/L (ref 19–32)
Calcium: 9.4 mg/dL (ref 8.4–10.5)
Chloride: 102 meq/L (ref 96–112)
Creatinine, Ser: 0.72 mg/dL (ref 0.40–1.50)
GFR: 91.95 mL/min (ref >60.00)
Glucose, Bld: 137 mg/dL — ABNORMAL HIGH (ref 70–99)
Potassium: 4.1 meq/L (ref 3.5–5.1)
Sodium: 141 meq/L (ref 135–145)
Total Bilirubin: 0.7 mg/dL (ref 0.2–1.2)
Total Protein: 6.5 g/dL (ref 6.0–8.3)

## 2023-06-28 LAB — LIPID PANEL
Cholesterol: 133 mg/dL (ref 0–200)
HDL: 34.5 mg/dL — ABNORMAL LOW (ref >39.00)
LDL Cholesterol: 67 mg/dL (ref 0–99)
NonHDL: 98.55
Total CHOL/HDL Ratio: 4
Triglycerides: 160 mg/dL — ABNORMAL HIGH (ref 0.0–149.0)
VLDL: 32 mg/dL (ref 0.0–40.0)

## 2023-06-28 LAB — MICROALBUMIN / CREATININE URINE RATIO
Creatinine,U: 108.1 mg/dL
Microalb Creat Ratio: 9 mg/g (ref 0.0–30.0)
Microalb, Ur: 1 mg/dL (ref 0.0–1.9)

## 2023-06-28 LAB — TSH: TSH: 1.16 u[IU]/mL (ref 0.35–5.50)

## 2023-06-28 LAB — PSA, MEDICARE: PSA: 0 ng/mL — ABNORMAL LOW (ref 0.10–4.00)

## 2023-07-05 ENCOUNTER — Encounter: Payer: Self-pay | Admitting: Nurse Practitioner

## 2023-08-19 ENCOUNTER — Other Ambulatory Visit: Payer: Self-pay | Admitting: Nurse Practitioner

## 2023-08-19 DIAGNOSIS — E1165 Type 2 diabetes mellitus with hyperglycemia: Secondary | ICD-10-CM

## 2023-08-19 DIAGNOSIS — Z8739 Personal history of other diseases of the musculoskeletal system and connective tissue: Secondary | ICD-10-CM

## 2023-08-19 DIAGNOSIS — I10 Essential (primary) hypertension: Secondary | ICD-10-CM

## 2023-09-03 ENCOUNTER — Other Ambulatory Visit: Payer: Self-pay | Admitting: Nurse Practitioner

## 2023-09-03 DIAGNOSIS — I10 Essential (primary) hypertension: Secondary | ICD-10-CM

## 2023-09-05 ENCOUNTER — Other Ambulatory Visit: Payer: Self-pay | Admitting: Nurse Practitioner

## 2023-10-27 ENCOUNTER — Other Ambulatory Visit: Payer: Self-pay | Admitting: Nurse Practitioner

## 2023-10-27 DIAGNOSIS — E785 Hyperlipidemia, unspecified: Secondary | ICD-10-CM

## 2023-10-27 DIAGNOSIS — I10 Essential (primary) hypertension: Secondary | ICD-10-CM

## 2023-11-15 ENCOUNTER — Ambulatory Visit: Payer: Medicare HMO

## 2023-11-15 VITALS — Ht 71.0 in | Wt 216.0 lb

## 2023-11-15 DIAGNOSIS — Z0000 Encounter for general adult medical examination without abnormal findings: Secondary | ICD-10-CM | POA: Diagnosis not present

## 2023-11-15 NOTE — Progress Notes (Signed)
 Subjective:   Joseph Stanton is a 73 y.o. who presents for a Medicare Wellness preventive visit.  As a reminder, Annual Wellness Visits don't include a physical exam, and some assessments may be limited, especially if this visit is performed virtually. We may recommend an in-person follow-up visit with your provider if needed.  Visit Complete: Virtual I connected with  Joseph Stanton on 11/15/23 by a audio enabled telemedicine application and verified that I am speaking with the correct person using two identifiers.  Patient Location: Home  Provider Location: Home Office  I discussed the limitations of evaluation and management by telemedicine. The patient expressed understanding and agreed to proceed.  Vital Signs: Because this visit was a virtual/telehealth visit, some criteria may be missing or patient reported. Any vitals not documented were not able to be obtained and vitals that have been documented are patient reported.  VideoDeclined- This patient declined Librarian, academic. Therefore the visit was completed with audio only.  Persons Participating in Visit: Patient.  AWV Questionnaire: No: Patient Medicare AWV questionnaire was not completed prior to this visit.  Cardiac Risk Factors include: advanced age (>48men, >81 women);diabetes mellitus;dyslipidemia;hypertension;male gender;obesity (BMI >30kg/m2);sedentary lifestyle     Objective:    Today's Vitals   11/15/23 1503  Weight: 216 lb (98 kg)  Height: 5' 11 (1.803 m)   Body mass index is 30.13 kg/m.     11/15/2023    3:12 PM 11/07/2022    2:41 PM 10/15/2017    6:06 PM  Advanced Directives  Does Patient Have a Medical Advance Directive? No No No   Would patient like information on creating a medical advance directive?  No - Patient declined No - Patient declined      Data saved with a previous flowsheet row definition    Current Medications (verified) Outpatient Encounter Medications  as of 11/15/2023  Medication Sig   allopurinol  (ZYLOPRIM ) 100 MG tablet TAKE 1 TABLET BY MOUTH TWICE A DAY   amLODipine  (NORVASC ) 5 MG tablet TAKE 1 TABLET (5 MG TOTAL) BY MOUTH DAILY.   Blood Glucose Monitoring Suppl DEVI 1 each by Does not apply route as directed. May substitute to any manufacturer covered by patient's insurance.   carvedilol  (COREG ) 25 MG tablet TAKE 1 TABLET TWICE DAILY   cloNIDine  (CATAPRES ) 0.2 MG tablet Take by mouth.   Glucose Blood (BLOOD GLUCOSE TEST STRIPS) STRP 1 each by In Vitro route daily. May substitute to any manufacturer covered by patient's insurance.   ketoconazole (NIZORAL) 2 % shampoo APPLY TO AFFECTED AREA TWICE A DAY   Lancets (ONETOUCH DELICA PLUS LANCET33G) MISC Apply 1 each topically daily.   Lancets Misc. MISC 1 each by Does not apply route daily. May substitute to any manufacturer covered by patient's insurance.   lovastatin  (MEVACOR ) 40 MG tablet TAKE 1 TABLET EVERY DAY   metFORMIN  (GLUCOPHAGE -XR) 500 MG 24 hr tablet TAKE 2 TABLETS BY MOUTH EVERY DAY WITH BREAKFAST   Multiple Vitamin (ESSENTIAL ONE DAILY PO) Take 1 capsule by mouth daily.   Multiple Vitamins-Minerals (HAIR/SKIN/NAILS/BIOTIN PO) Take 1 capsule by mouth daily.   omeprazole  (PRILOSEC) 40 MG capsule Take 1 capsule (40 mg total) by mouth every other day.   valsartan  (DIOVAN ) 320 MG tablet TAKE 1 TABLET BY MOUTH EVERY DAY   neomycin -polymyxin-hydrocortisone (CORTISPORIN) OTIC solution Place 3 drops into the left ear 4 (four) times daily. (Patient not taking: Reported on 11/15/2023)   No facility-administered encounter medications on file as of 11/15/2023.  Allergies (verified) Patient has no known allergies.   History: Past Medical History:  Diagnosis Date   Allergy    Arthritis    Cancer (HCC)    prostate   Deviated nasal septum    GERD (gastroesophageal reflux disease)    Gout    Hyperlipidemia    Hypertension    Spinal stenosis    Past Surgical History:  Procedure  Laterality Date   ESOPHAGOGASTRODUODENOSCOPY (EGD) WITH PROPOFOL  N/A 10/16/2017   Procedure: ESOPHAGOGASTRODUODENOSCOPY (EGD) WITH PROPOFOL ;  Surgeon: Legrand Victory LITTIE DOUGLAS, MD;  Location: [facility];  Service: Gastroenterology;  Laterality: N/A;   FOREIGN BODY REMOVAL  10/16/2017   Procedure: FOREIGN BODY REMOVAL;  Surgeon: Legrand Victory LITTIE DOUGLAS, MD;  Location: [facility];  Service: Gastroenterology;;   NASAL SEPTUM SURGERY     PROSTATECTOMY     SPINE SURGERY     UPPER GASTROINTESTINAL ENDOSCOPY     Family History  Problem Relation Age of Onset   Hypertension Mother    Hypertension Father    Colon cancer Neg Hx    Colon polyps Neg Hx    Esophageal cancer Neg Hx    Rectal cancer Neg Hx    Stomach cancer Neg Hx    Social History   Socioeconomic History   Marital status: Married    Spouse name: Not on file   Number of children: 2   Years of education: Not on file   Highest education level: Not on file  Occupational History   Not on file  Tobacco Use   Smoking status: Never   Smokeless tobacco: Never  Vaping Use   Vaping status: Never Used  Substance and Sexual Activity   Alcohol use: No   Drug use: No   Sexual activity: Not on file  Other Topics Concern   Not on file  Social History Narrative   Not on file   Social Drivers of Health   Financial Resource Strain: Low Risk  (11/15/2023)   Overall Financial Resource Strain (CARDIA)    Difficulty of Paying Living Expenses: Not hard at all  Food Insecurity: No Food Insecurity (11/15/2023)   Hunger Vital Sign    Worried About Running Out of Food in the Last Year: Never true    Ran Out of Food in the Last Year: Never true  Transportation Needs: No Transportation Needs (11/15/2023)   PRAPARE - Administrator, Civil Service (Medical): No    Lack of Transportation (Non-Medical): No  Physical Activity: Inactive (11/15/2023)   Exercise Vital Sign    Days of Exercise per Week: 0 days    Minutes of Exercise per Session: 0 min   Stress: No Stress Concern Present (11/15/2023)   [employer] of Occupational Health - Occupational Stress Questionnaire    Feeling of Stress: Not at all  Social Connections: Moderately Isolated (11/15/2023)   Social Connection and Isolation Panel    Frequency of Communication with Friends and Family: More than three times a week    Frequency of Social Gatherings with Friends and Family: More than three times a week    Attends Religious Services: Never    Database administrator or Organizations: No    Attends Engineer, structural: Never    Marital Status: Married    Tobacco Counseling Counseling given: Not Answered   Clinical Intake:  Pre-visit preparation completed: Yes  Pain : No/denies pain    BMI - recorded: 30.13 Nutritional Status: BMI > 30  Obese Nutritional  Risks: None Diabetes: Yes CBG done?: No Did pt. bring in CBG monitor from home?: No  Lab Results  Component Value Date   HGBA1C 7.3 (A) 06/27/2023   HGBA1C 9.0 (A) 02/23/2023   HGBA1C 7.9 (H) 07/21/2022     How often do you need to have someone help you when you read instructions, pamphlets, or other written materials from your doctor or pharmacy?: 1 - Never  Interpreter Needed?: No  Information entered by :: B.Hasna Stefanik,LPN   Activities of Daily Living     11/15/2023    3:12 PM  In your present state of health, do you have any difficulty performing the following activities:  Hearing? 0  Vision? 0  Difficulty concentrating or making decisions? 0  Walking or climbing stairs? 0  Dressing or bathing? 0  Doing errands, shopping? 0  Preparing Food and eating ? N  Using the Toilet? N  In the past six months, have you accidently leaked urine? N  Do you have problems with loss of bowel control? N  Managing your Medications? N  Managing your Finances? N  Housekeeping or managing your Housekeeping? N    Patient Care Team: Wendee Lynwood HERO, NP as PCP - General (Nurse Practitioner) [facility]  I have updated your Care Teams any recent Medical Services you may have received from other providers in the past year.     Assessment:   This is a routine wellness examination for Joseph Stanton.  Hearing/Vision screen Hearing Screening - Comments:: Patient denies any hearing difficulties.   Vision Screening - Comments:: Pt says their vision is good with glasses [facility]   Goals Addressed             This Visit's Progress    Patient Stated   On track    11/15/23-Maintain current health status       Depression Screen     11/15/2023    3:09 PM 11/07/2022    2:32 PM 07/21/2022    2:54 PM  PHQ 2/9 Scores  PHQ - 2 Score 0 0 0  PHQ- 9 Score   3    Fall Risk     11/15/2023    3:07 PM 11/07/2022    2:34 PM 07/21/2022    2:54 PM 07/21/2022    2:53 PM  Fall Risk   Falls in the past year? 0 0 0 0  Number falls in past yr: 0 0 0 0  Injury with Fall? 0 0 0 0  Risk for fall due to : No Fall Risks No Fall Risks No Fall Risks No Fall Risks  Follow up Education provided;Falls prevention discussed Falls prevention discussed;Falls evaluation completed Falls evaluation completed Falls evaluation completed    MEDICARE RISK AT HOME:  Medicare Risk at Home Any stairs in or around the home?: Yes (ramp) If so, are there any without handrails?: Yes Home free of loose throw rugs in walkways, pet beds, electrical cords, etc?: Yes Adequate lighting in your home to reduce risk of falls?: Yes Life alert?: No Use of a cane, walker or w/c?: No Grab bars in the bathroom?: Yes Shower chair or bench in shower?: Yes Elevated toilet seat or a handicapped toilet?: Yes  TIMED UP AND GO:  Was the test performed?  No  Cognitive Function: 6CIT completed        11/15/2023    3:19 PM 11/07/2022    2:43 PM  6CIT Screen  What Year? 0 points  0 points  What month? 0 points 0 points  What time? 0 points 0 points  Count back from 20 0 points 0 points  Months in reverse 0  points 0 points  Repeat phrase 0 points 2 points  Total Score 0 points 2 points    Immunizations Immunization History  Administered Date(s) Administered   Fluad Trivalent(High Dose 65+) 01/26/2023   Influenza Inj Mdck Quad Pf 12/29/2016   Pfizer(Comirnaty)Fall Seasonal Vaccine 12 years and older 01/26/2023   Zoster Recombinant(Shingrix) 12/30/2018, 04/04/2019    Screening Tests Health Maintenance  Topic Date Due   FOOT EXAM  Never done   OPHTHALMOLOGY EXAM  Never done   Hepatitis C Screening  Never done   DTaP/Tdap/Td (1 - Tdap) Never done   Pneumococcal Vaccine: 50+ Years (1 of 2 - PCV) Never done   Colonoscopy  Never done   INFLUENZA VACCINE  10/12/2023   COVID-19 Vaccine (2 - Pfizer risk series) 02/23/2024 (Originally 02/16/2023)   HEMOGLOBIN A1C  12/27/2023   Diabetic kidney evaluation - eGFR measurement  06/26/2024   Diabetic kidney evaluation - Urine ACR  06/26/2024   Medicare Annual Wellness (AWV)  11/14/2024   Zoster Vaccines- Shingrix  Completed   HPV VACCINES  Aged Out   Meningococcal B Vaccine  Aged Out    Health Maintenance  Health Maintenance Due  Topic Date Due   FOOT EXAM  Never done   OPHTHALMOLOGY EXAM  Never done   Hepatitis C Screening  Never done   DTaP/Tdap/Td (1 - Tdap) Never done   Pneumococcal Vaccine: 50+ Years (1 of 2 - PCV) Never done   Colonoscopy  Never done   INFLUENZA VACCINE  10/12/2023   Health Maintenance Items Addressed: Pt says he had colonoscopy 2 years ago in [place] Pt has appt w/PCP tomorrow for foot exam, Hep C and vaccines desired.  Additional Screening:  Vision Screening: Recommended annual ophthalmology exams for early detection of glaucoma and other disorders of the eye. Would you like a referral to an eye doctor? No  pt will says he will make appt w/eye   Dental Screening: Recommended annual dental exams for proper oral hygiene  Community Resource Referral / Chronic Care Management: CRR required this visit?  No    CCM required this visit?  No   Plan:    I have personally reviewed and noted the following in the patient's chart:   Medical and social history Use of alcohol, tobacco or illicit drugs  Current medications and supplements including opioid prescriptions. Patient is not currently taking opioid prescriptions. Functional ability and status Nutritional status Physical activity Advanced directives List of other physicians Hospitalizations, surgeries, and ER visits in previous 12 months Vitals Screenings to include cognitive, depression, and falls Referrals and appointments  In addition, I have reviewed and discussed with patient certain preventive protocols, quality metrics, and best practice recommendations. A written personalized care plan for preventive services as well as general preventive health recommendations were provided to patient.   Joseph LITTIE Saris, LPN   [id number]   After Visit Summary: (Declined) Due to this being a telephonic visit, with patients personalized plan was offered to patient but patient Declined AVS at this time   Notes: Nothing significant to report at this time.

## 2023-11-15 NOTE — Patient Instructions (Signed)
 Mr. Joseph Stanton , Thank you for taking time out of your busy schedule to complete your Annual Wellness Visit with me. I enjoyed our conversation and look forward to speaking with you again next year. I, as well as your care team,  appreciate your ongoing commitment to your health goals. Please review the following plan we discussed and let me know if I can assist you in the future. Your Game plan/ To Do List    Referrals: If you haven't heard from the office you've been referred to, please reach out to them at the phone provided.   Follow up Visits: We will see or speak with you next year for your Next Medicare AWV with our clinical staff Have you seen your provider in the last 6 months (3 months if uncontrolled diabetes)? Yes  Clinician Recommendations:  Aim for 30 minutes of exercise or brisk walking, 6-8 glasses of water, and 5 servings of fruits and vegetables each day.       This is a list of the screenings recommended for you:  Health Maintenance  Topic Date Due   Complete foot exam   Never done   Eye exam for diabetics  Never done   Hepatitis C Screening  Never done   DTaP/Tdap/Td vaccine (1 - Tdap) Never done   Pneumococcal Vaccine for age over 71 (1 of 2 - PCV) Never done   Colon Cancer Screening  Never done   Flu Shot  10/12/2023   COVID-19 Vaccine (2 - Pfizer risk series) 02/23/2024*   Hemoglobin A1C  12/27/2023   Yearly kidney function blood test for diabetes  06/26/2024   Yearly kidney health urinalysis for diabetes  06/26/2024   Medicare Annual Wellness Visit  11/14/2024   Zoster (Shingles) Vaccine  Completed   HPV Vaccine  Aged Out   Meningitis B Vaccine  Aged Out  *Topic was postponed. The date shown is not the original due date.    Advanced directives: (Copy Requested) Please bring a copy of your health care power of attorney and living will to the office to be added to your chart at your convenience. You can mail to [facility] [address]  [place], [place] [address] or email to [email or web address] Advance Care Planning is important because it:  [x]  Makes sure you receive the medical care that is consistent with your values, goals, and preferences  [x]  It provides guidance to your family and loved ones and reduces their decisional burden about whether or not they are making the right decisions based on your wishes.  Follow the link provided in your after visit summary or read over the paperwork we have mailed to you to help you started getting your Advance Directives in place. If you need assistance in completing these, please reach out to us  so that we can help you!

## 2023-11-16 ENCOUNTER — Encounter: Payer: Self-pay | Admitting: Nurse Practitioner

## 2023-11-16 ENCOUNTER — Ambulatory Visit: Admitting: Nurse Practitioner

## 2023-11-16 VITALS — BP 102/62 | HR 68 | Temp 97.9°F | Ht 71.0 in | Wt 206.0 lb

## 2023-11-16 DIAGNOSIS — I10 Essential (primary) hypertension: Secondary | ICD-10-CM | POA: Diagnosis not present

## 2023-11-16 DIAGNOSIS — E1165 Type 2 diabetes mellitus with hyperglycemia: Secondary | ICD-10-CM | POA: Diagnosis not present

## 2023-11-16 DIAGNOSIS — Z7984 Long term (current) use of oral hypoglycemic drugs: Secondary | ICD-10-CM

## 2023-11-16 DIAGNOSIS — Z23 Encounter for immunization: Secondary | ICD-10-CM | POA: Diagnosis not present

## 2023-11-16 DIAGNOSIS — K219 Gastro-esophageal reflux disease without esophagitis: Secondary | ICD-10-CM | POA: Diagnosis not present

## 2023-11-16 DIAGNOSIS — Z0000 Encounter for general adult medical examination without abnormal findings: Secondary | ICD-10-CM | POA: Diagnosis not present

## 2023-11-16 DIAGNOSIS — E669 Obesity, unspecified: Secondary | ICD-10-CM

## 2023-11-16 DIAGNOSIS — E785 Hyperlipidemia, unspecified: Secondary | ICD-10-CM | POA: Diagnosis not present

## 2023-11-16 DIAGNOSIS — Z8739 Personal history of other diseases of the musculoskeletal system and connective tissue: Secondary | ICD-10-CM

## 2023-11-16 NOTE — Assessment & Plan Note (Signed)
 Patient currently maintained on valsartan  320 mg daily, amlodipine  5 mg daily.  Blood pressure controlled continue medication as prescribed

## 2023-11-16 NOTE — Assessment & Plan Note (Signed)
 History of the same patient had upper GI on 10/16/2017 patient currently maintained on omeprazole  40 mg daily continue

## 2023-11-16 NOTE — Assessment & Plan Note (Signed)
 Currently maintained on metformin  1000 mg daily.  A1c controlled at 7.1%.  Continue taking medication as prescribed continue working healthy lifestyle modifications.

## 2023-11-16 NOTE — Progress Notes (Signed)
 Established Patient Office Visit  Subjective   Patient ID: Joseph Stanton, male    DOB: [birth year]  Age: 73 y.o. MRN: [id number]  Chief Complaint  Patient presents with   Annual Exam    HPI 7.1  DM2: Patient currently maintained on metformin  1000 mg daily. Does not check glucose at home  Gout: Currently allopurinol  100mg  daily   HTN: Patient currently maintained on amlodipine  5 mg daily,  valsartan  320 mg daily  HLD: Patient currently maintained on lovastatin  40 mg daily  GERD: Patient currently maintained on omeprazole  40 mg daily. Upper endoscopy 10/16/2017.  That requested daily PPI use  for complete physical and follow up of chronic conditions.  Immunizations: -Tetanus: Completed in unsure get at local pharmacy -Influenza: Update today -Shingles: Completed Shingrix series -Pneumonia: up date today  Diet: Fair diet. States that he will snack and will get once good meal. He is drinking the prepared coffees, orange juice and coffee  Exercise: No regular exercise.  Eye exam: Completes annually. Glasses   Dental exam: Completes semi-annually    Colonoscopy: Completed in wihtin 5 years. Lung Cancer Screening: N/A  PSA: Up-to-date done April 2025  Sleep: goes to bed around around 1 and then get up around 8.   Advanced directive: has a will and HPOA       Review of Systems  Constitutional:  Negative for chills and fever.  Respiratory:  Negative for shortness of breath.   Cardiovascular:  Negative for chest pain and leg swelling.  Gastrointestinal:  Negative for abdominal pain, blood in stool, constipation, diarrhea, nausea and vomiting.       BM daily  Genitourinary:  Negative for dysuria and hematuria.  Neurological:  Negative for tingling (feet that is positional) and headaches.  Psychiatric/Behavioral:  Negative for hallucinations and suicidal ideas.       Objective:     BP 102/62   Pulse 68   Temp 97.9 F (36.6 C) (Oral)   Ht 5' 11 (1.803 m)    Wt 206 lb (93.4 kg)   SpO2 97%   BMI 28.73 kg/m  BP Readings from Last 3 Encounters:  11/16/23 102/62  06/27/23 130/70  02/23/23 (!) 148/92   Wt Readings from Last 3 Encounters:  11/16/23 206 lb (93.4 kg)  11/15/23 216 lb (98 kg)  06/27/23 216 lb (98 kg)   SpO2 Readings from Last 3 Encounters:  11/16/23 97%  06/27/23 97%  02/23/23 98%      Physical Exam Vitals and nursing note reviewed.  Constitutional:      Appearance: Normal appearance.  HENT:     Right Ear: Tympanic membrane, ear canal and external ear normal.     Left Ear: Tympanic membrane, ear canal and external ear normal.     Mouth/Throat:     Mouth: Mucous membranes are moist.     Pharynx: Oropharynx is clear.  Eyes:     Extraocular Movements: Extraocular movements intact.     Pupils: Pupils are equal, round, and reactive to light.  Cardiovascular:     Rate and Rhythm: Normal rate and regular rhythm.     Pulses: Normal pulses.     Heart sounds: Normal heart sounds.  Pulmonary:     Effort: Pulmonary effort is normal.     Breath sounds: Normal breath sounds.  Abdominal:     General: Bowel sounds are normal. There is no distension.     Palpations: There is no mass.     Tenderness: There is no  abdominal tenderness.     Hernia: No hernia is present.  Musculoskeletal:     Right lower leg: No edema.     Left lower leg: No edema.  Lymphadenopathy:     Cervical: No cervical adenopathy.  Skin:    General: Skin is warm.  Neurological:     General: No focal deficit present.     Mental Status: He is alert.     Deep Tendon Reflexes:     Reflex Scores:      Bicep reflexes are 2+ on the right side and 2+ on the left side.      Patellar reflexes are 2+ on the right side and 2+ on the left side.    Comments: Bilateral upper and lower extremity strength 5/5  Psychiatric:        Mood and Affect: Mood normal.        Behavior: Behavior normal.        Thought Content: Thought content normal.        Judgment:  Judgment normal.    Title   Diabetic Foot Exam - detailed Is there a history of foot ulcer?: No Is there a foot ulcer now?: No Is there swelling?: No Is there elevated skin temperature?: No Is there abnormal foot shape?: No Is there a claw toe deformity?: No Are the toenails long?: No Are the toenails thick?: No Are the toenails ingrown?: No Pulse Foot Exam completed.: Yes   Right Posterior Tibialis: Present Left posterior Tibialis: Present   Right Dorsalis Pedis: Present Left Dorsalis Pedis: Present     Sensory Foot Exam Completed.: Yes Semmes-Weinstein Monofilament Test + means has sensation and - means no sensation      Image components are not supported.   Image components are not supported. Image components are not supported.  Tuning Fork Comments All 10 sites tested sensation intact bilaterally      No results found for any visits on 11/16/23.    The ASCVD Risk score (Arnett DK, et al., 2019) failed to calculate for the following reasons:   Risk score cannot be calculated because patient has a medical history suggesting prior/existing ASCVD    Assessment & Plan:   Problem List Items Addressed This Visit       Cardiovascular and Mediastinum   Essential hypertension   Patient currently maintained on valsartan  320 mg daily, amlodipine  5 mg daily.  Blood pressure controlled continue medication as prescribed        Digestive   Gastroesophageal reflux disease   History of the same patient had upper GI on 10/16/2017 patient currently maintained on omeprazole  40 mg daily continue        Endocrine   Type 2 diabetes mellitus with hyperglycemia, without long-term current use of insulin (HCC)   Currently maintained on metformin  1000 mg daily.  A1c controlled at 7.1%.  Continue taking medication as prescribed continue working healthy lifestyle modifications.        Other   Hyperlipidemia   History of the same.  Patient currently maintained on  lovastatin  40 mg.  LDL within normal limits.  Continue medication as prescribed      History of gout   Currently maintained on allopurinol  100 mg daily.  Gout seems to be in remission.  Continue      Obesity (BMI 30-39.9)   Reviewed most recent labs continue working healthy lifestyle modifications patient has lost weight approximately 10 pounds since last office visit      Preventative health  care - Primary   Discussed age-appropriate immunizations and screening exams.  Did review patient's personal, surgical, social, family histories.  Patient's up-to-date on all age-appropriate vaccinations he would like.  Get tetanus shot at local pharmacy.  Update pneumonia and flu vaccine today.  Patient is up-to-date on CRC screening per his report.  PSA is up-to-date.  Patient was given information at discharge about preventative healthcare maintenance with anticipatory guidance.      Other Visit Diagnoses       Need for pneumococcal 20-valent conjugate vaccination       Relevant Orders   Pneumococcal conjugate vaccine 20-valent (Prevnar 20) (Completed)     Need for influenza vaccination       Relevant Orders   Flu vaccine HIGH DOSE PF(Fluzone Trivalent) (Completed)       Return in about 3 months (around 02/15/2024) for DM recheck/weight .    Adina Crandall, NP

## 2023-11-16 NOTE — Assessment & Plan Note (Signed)
 Discussed age-appropriate immunizations and screening exams.  Did review patient's personal, surgical, social, family histories.  Patient's up-to-date on all age-appropriate vaccinations he would like.  Get tetanus shot at local pharmacy.  Update pneumonia and flu vaccine today.  Patient is up-to-date on CRC screening per his report.  PSA is up-to-date.  Patient was given information at discharge about preventative healthcare maintenance with anticipatory guidance.

## 2023-11-16 NOTE — Patient Instructions (Signed)
 Nice to see you today We did update your flu and pneumonia vaccine Follow up with me in 3 months

## 2023-11-16 NOTE — Assessment & Plan Note (Signed)
 Currently maintained on allopurinol  100 mg daily.  Gout seems to be in remission.  Continue

## 2023-11-16 NOTE — Assessment & Plan Note (Signed)
 Reviewed most recent labs continue working healthy lifestyle modifications patient has lost weight approximately 10 pounds since last office visit

## 2023-11-16 NOTE — Assessment & Plan Note (Signed)
 History of the same.  Patient currently maintained on lovastatin  40 mg.  LDL within normal limits.  Continue medication as prescribed

## 2023-12-06 ENCOUNTER — Other Ambulatory Visit: Payer: Self-pay | Admitting: Nurse Practitioner

## 2023-12-06 DIAGNOSIS — K219 Gastro-esophageal reflux disease without esophagitis: Secondary | ICD-10-CM

## 2024-01-10 ENCOUNTER — Other Ambulatory Visit: Payer: Self-pay | Admitting: Nurse Practitioner

## 2024-01-10 DIAGNOSIS — I10 Essential (primary) hypertension: Secondary | ICD-10-CM

## 2024-01-10 DIAGNOSIS — E785 Hyperlipidemia, unspecified: Secondary | ICD-10-CM

## 2024-02-13 ENCOUNTER — Other Ambulatory Visit: Payer: Self-pay | Admitting: Nurse Practitioner

## 2024-02-13 DIAGNOSIS — K219 Gastro-esophageal reflux disease without esophagitis: Secondary | ICD-10-CM

## 2024-02-15 ENCOUNTER — Ambulatory Visit: Admitting: Nurse Practitioner

## 2024-02-15 ENCOUNTER — Telehealth: Payer: Self-pay

## 2024-02-15 NOTE — Telephone Encounter (Signed)
 Called and spoke with patient.  Informed him that labs won't be done during appointment.

## 2024-02-15 NOTE — Telephone Encounter (Signed)
 Copied from CRM #[id number]. Topic: Clinical - Request for Lab/Test Order >> Feb 15, 2024  9:21 AM Viola FALCON wrote: Reason for CRM: Patient has appointment 02/22/24 with Adina Crandall and wants to know if he's getting blood work and if he needs to fast? Please call him at [phone] (M)

## 2024-02-17 ENCOUNTER — Other Ambulatory Visit: Payer: Self-pay | Admitting: Nurse Practitioner

## 2024-02-17 DIAGNOSIS — Z8739 Personal history of other diseases of the musculoskeletal system and connective tissue: Secondary | ICD-10-CM

## 2024-02-17 DIAGNOSIS — E1165 Type 2 diabetes mellitus with hyperglycemia: Secondary | ICD-10-CM

## 2024-02-17 DIAGNOSIS — I10 Essential (primary) hypertension: Secondary | ICD-10-CM

## 2024-02-22 ENCOUNTER — Ambulatory Visit: Admitting: Nurse Practitioner

## 2024-04-16 ENCOUNTER — Ambulatory Visit: Admitting: Nurse Practitioner

## 2024-05-22 ENCOUNTER — Ambulatory Visit: Admitting: Nurse Practitioner

## 2024-11-18 ENCOUNTER — Ambulatory Visit
